# Patient Record
Sex: Male | Born: 1960 | Race: White | Hispanic: No | Marital: Married | Smoking: Never smoker
Health system: Southern US, Community
[De-identification: ages and names within clinical notes are randomized; demographics above are authoritative.]

## PROBLEM LIST (undated history)

## (undated) DIAGNOSIS — Z9889 Other specified postprocedural states: Secondary | ICD-10-CM

## (undated) DIAGNOSIS — R112 Nausea with vomiting, unspecified: Secondary | ICD-10-CM

## (undated) HISTORY — PX: ELBOW SURGERY: SHX618

## (undated) HISTORY — PX: APPENDECTOMY: SHX54

## (undated) HISTORY — PX: KNEE SURGERY: SHX244

---

## 2001-03-16 ENCOUNTER — Encounter: Payer: Self-pay | Admitting: Psychiatry

## 2001-03-16 ENCOUNTER — Ambulatory Visit (HOSPITAL_COMMUNITY): Admission: RE | Admit: 2001-03-16 | Discharge: 2001-03-16 | Payer: Self-pay | Admitting: Psychiatry

## 2010-08-14 ENCOUNTER — Ambulatory Visit (HOSPITAL_COMMUNITY)
Admission: RE | Admit: 2010-08-14 | Discharge: 2010-08-14 | Payer: Self-pay | Source: Home / Self Care | Attending: Gastroenterology | Admitting: Gastroenterology

## 2016-11-22 ENCOUNTER — Observation Stay (HOSPITAL_COMMUNITY)
Admission: EM | Admit: 2016-11-22 | Discharge: 2016-11-23 | Disposition: A | Discharge status: Home / Self Care | Payer: BLUE CROSS/BLUE SHIELD | Attending: Surgery | Admitting: Surgery

## 2016-11-22 ENCOUNTER — Emergency Department (HOSPITAL_COMMUNITY): Payer: BLUE CROSS/BLUE SHIELD

## 2016-11-22 ENCOUNTER — Encounter (HOSPITAL_COMMUNITY)
Admission: EM | Disposition: A | Discharge status: Home / Self Care | Payer: Self-pay | Source: Home / Self Care | Attending: Emergency Medicine

## 2016-11-22 ENCOUNTER — Ambulatory Visit (HOSPITAL_COMMUNITY)
Admission: EM | Admit: 2016-11-22 | Discharge: 2016-11-22 | Disposition: A | Discharge status: Home / Self Care | Payer: BLUE CROSS/BLUE SHIELD | Source: Home / Self Care

## 2016-11-22 ENCOUNTER — Encounter (HOSPITAL_COMMUNITY): Payer: Self-pay | Admitting: Emergency Medicine

## 2016-11-22 ENCOUNTER — Emergency Department (HOSPITAL_COMMUNITY): Payer: BLUE CROSS/BLUE SHIELD | Admitting: Anesthesiology

## 2016-11-22 DIAGNOSIS — K3580 Unspecified acute appendicitis: Secondary | ICD-10-CM | POA: Diagnosis present

## 2016-11-22 DIAGNOSIS — R109 Unspecified abdominal pain: Secondary | ICD-10-CM | POA: Diagnosis present

## 2016-11-22 DIAGNOSIS — K353 Acute appendicitis with localized peritonitis, without perforation or gangrene: Secondary | ICD-10-CM

## 2016-11-22 DIAGNOSIS — R1031 Right lower quadrant pain: Secondary | ICD-10-CM | POA: Diagnosis not present

## 2016-11-22 DIAGNOSIS — K429 Umbilical hernia without obstruction or gangrene: Secondary | ICD-10-CM | POA: Diagnosis not present

## 2016-11-22 HISTORY — PX: LAPAROSCOPIC APPENDECTOMY: SHX408

## 2016-11-22 HISTORY — PX: UMBILICAL HERNIA REPAIR: SHX196

## 2016-11-22 LAB — COMPREHENSIVE METABOLIC PANEL
ALT: 22 U/L (ref 17–63)
AST: 44 U/L — ABNORMAL HIGH (ref 15–41)
Albumin: 4.6 g/dL (ref 3.5–5.0)
Alkaline Phosphatase: 81 U/L (ref 38–126)
Anion gap: 7 (ref 5–15)
BUN: 15 mg/dL (ref 6–20)
CO2: 26 mmol/L (ref 22–32)
Calcium: 9.1 mg/dL (ref 8.9–10.3)
Chloride: 103 mmol/L (ref 101–111)
Creatinine, Ser: 1.07 mg/dL (ref 0.61–1.24)
GFR calc Af Amer: >60 mL/min (ref >60)
GFR calc non Af Amer: >60 mL/min (ref >60)
Glucose, Bld: 99 mg/dL (ref 65–99)
Potassium: 4.4 mmol/L (ref 3.5–5.1)
Sodium: 136 mmol/L (ref 135–145)
Total Bilirubin: 0.6 mg/dL (ref 0.3–1.2)
Total Protein: 7.3 g/dL (ref 6.5–8.1)

## 2016-11-22 LAB — CBC
HCT: 44 % (ref 39.0–52.0)
Hemoglobin: 15 g/dL (ref 13.0–17.0)
MCH: 30.3 pg (ref 26.0–34.0)
MCHC: 34.1 g/dL (ref 30.0–36.0)
MCV: 88.9 fL (ref 78.0–100.0)
Platelets: 197 10*3/uL (ref 150–400)
RBC: 4.95 MIL/uL (ref 4.22–5.81)
RDW: 12.8 % (ref 11.5–15.5)
WBC: 9.9 10*3/uL (ref 4.0–10.5)

## 2016-11-22 LAB — URINALYSIS, ROUTINE W REFLEX MICROSCOPIC
Bilirubin Urine: NEGATIVE
Glucose, UA: NEGATIVE mg/dL
Hgb urine dipstick: NEGATIVE
Ketones, ur: NEGATIVE mg/dL
Leukocytes, UA: NEGATIVE
Nitrite: NEGATIVE
Protein, ur: NEGATIVE mg/dL
Specific Gravity, Urine: 1.008 (ref 1.005–1.030)
pH: 5 (ref 5.0–8.0)

## 2016-11-22 LAB — LIPASE, BLOOD: Lipase: 37 U/L (ref 11–51)

## 2016-11-22 SURGERY — APPENDECTOMY, LAPAROSCOPIC
Anesthesia: General | Site: Abdomen

## 2016-11-22 MED ORDER — DIPHENHYDRAMINE HCL 25 MG PO CAPS: 25.0000 mg | ORAL_CAPSULE | Freq: Four times a day (QID) | ORAL | Status: DC | PRN

## 2016-11-22 MED ORDER — MIDAZOLAM HCL 2 MG/2ML IJ SOLN
INTRAMUSCULAR | Status: AC
  Filled 2016-11-22: qty 2

## 2016-11-22 MED ORDER — SODIUM CHLORIDE 0.9 % IV SOLN
INTRAVENOUS | Status: DC
  Administered 2016-11-22: 1 mL via INTRAVENOUS

## 2016-11-22 MED ORDER — ONDANSETRON HCL 4 MG/2ML IJ SOLN
4.0000 mg | Freq: Four times a day (QID) | INTRAMUSCULAR | Status: DC | PRN
  Administered 2016-11-23 (×2): 4 mg via INTRAVENOUS
  Filled 2016-11-22 (×3): qty 2

## 2016-11-22 MED ORDER — MIDAZOLAM HCL 5 MG/5ML IJ SOLN
INTRAMUSCULAR | Status: DC | PRN
  Administered 2016-11-22: 2 mg via INTRAVENOUS

## 2016-11-22 MED ORDER — MORPHINE SULFATE (PF) 2 MG/ML IV SOLN
2.0000 mg | INTRAVENOUS | Status: DC | PRN
  Administered 2016-11-23: 4 mg via INTRAVENOUS
  Filled 2016-11-22: qty 2

## 2016-11-22 MED ORDER — METRONIDAZOLE IN NACL 5-0.79 MG/ML-% IV SOLN
INTRAVENOUS | Status: DC | PRN
  Administered 2016-11-22: 500 mg via INTRAVENOUS

## 2016-11-22 MED ORDER — CLONIDINE HCL 0.1 MG PO TABS: 0.1000 mg | ORAL_TABLET | Freq: Once | ORAL | Status: DC

## 2016-11-22 MED ORDER — METRONIDAZOLE IN NACL 5-0.79 MG/ML-% IV SOLN
500.0000 mg | Freq: Three times a day (TID) | INTRAVENOUS | Status: DC
  Administered 2016-11-23: 500 mg via INTRAVENOUS
  Filled 2016-11-22 (×4): qty 100

## 2016-11-22 MED ORDER — SODIUM CHLORIDE 0.9 % IR SOLN
Status: DC | PRN
  Administered 2016-11-22: 1000 mL

## 2016-11-22 MED ORDER — IOPAMIDOL (ISOVUE-300) INJECTION 61%
INTRAVENOUS | Status: AC
  Administered 2016-11-22: 100 mL
  Filled 2016-11-22: qty 100

## 2016-11-22 MED ORDER — HYDROMORPHONE HCL 1 MG/ML IJ SOLN
0.2500 mg | INTRAMUSCULAR | Status: DC | PRN
  Administered 2016-11-22 (×2): 0.5 mg via INTRAVENOUS

## 2016-11-22 MED ORDER — OXYCODONE HCL 5 MG PO TABS: 5.0000 mg | ORAL_TABLET | ORAL | Status: DC | PRN

## 2016-11-22 MED ORDER — DEXTROSE 5 % IV SOLN
2.0000 g | Freq: Once | INTRAVENOUS | Status: AC
  Administered 2016-11-22: 2 g via INTRAVENOUS
  Filled 2016-11-22: qty 2

## 2016-11-22 MED ORDER — ONDANSETRON HCL 4 MG/2ML IJ SOLN
INTRAMUSCULAR | Status: DC | PRN
  Administered 2016-11-22: 4 mg via INTRAVENOUS

## 2016-11-22 MED ORDER — DIPHENHYDRAMINE HCL 50 MG/ML IJ SOLN: 25.0000 mg | Freq: Four times a day (QID) | INTRAMUSCULAR | Status: DC | PRN

## 2016-11-22 MED ORDER — ONDANSETRON 4 MG PO TBDP
4.0000 mg | ORAL_TABLET | Freq: Four times a day (QID) | ORAL | Status: DC | PRN
  Filled 2016-11-22: qty 1

## 2016-11-22 MED ORDER — IPRATROPIUM-ALBUTEROL 0.5-2.5 (3) MG/3ML IN SOLN: 3.0000 mL | Freq: Once | RESPIRATORY_TRACT | Status: DC

## 2016-11-22 MED ORDER — FENTANYL CITRATE (PF) 250 MCG/5ML IJ SOLN
INTRAMUSCULAR | Status: DC | PRN
  Administered 2016-11-22 (×5): 100 ug via INTRAVENOUS

## 2016-11-22 MED ORDER — ACETAMINOPHEN 650 MG RE SUPP: 650.0000 mg | Freq: Four times a day (QID) | RECTAL | Status: DC | PRN

## 2016-11-22 MED ORDER — HYDROMORPHONE HCL 1 MG/ML IJ SOLN
INTRAMUSCULAR | Status: AC
  Administered 2016-11-22: 0.5 mg via INTRAVENOUS
  Filled 2016-11-22: qty 1

## 2016-11-22 MED ORDER — SUGAMMADEX SODIUM 200 MG/2ML IV SOLN
INTRAVENOUS | Status: DC | PRN
  Administered 2016-11-22: 300 mg via INTRAVENOUS

## 2016-11-22 MED ORDER — BUPIVACAINE HCL (PF) 0.25 % IJ SOLN
INTRAMUSCULAR | Status: AC
  Filled 2016-11-22: qty 30

## 2016-11-22 MED ORDER — PROPOFOL 10 MG/ML IV BOLUS
INTRAVENOUS | Status: DC | PRN
  Administered 2016-11-22: 200 mg via INTRAVENOUS

## 2016-11-22 MED ORDER — ZOLPIDEM TARTRATE 5 MG PO TABS: 5.0000 mg | ORAL_TABLET | Freq: Every evening | ORAL | Status: DC | PRN

## 2016-11-22 MED ORDER — BUPIVACAINE HCL (PF) 0.25 % IJ SOLN
INTRAMUSCULAR | Status: DC | PRN
  Administered 2016-11-22: 7.5 mL

## 2016-11-22 MED ORDER — ROCURONIUM BROMIDE 100 MG/10ML IV SOLN
INTRAVENOUS | Status: DC | PRN
  Administered 2016-11-22: 40 mg via INTRAVENOUS

## 2016-11-22 MED ORDER — LACTATED RINGERS IV SOLN
INTRAVENOUS | Status: DC | PRN
Start: 2016-11-22 — End: 2016-11-22
  Administered 2016-11-22 (×2): via INTRAVENOUS

## 2016-11-22 MED ORDER — METRONIDAZOLE IN NACL 5-0.79 MG/ML-% IV SOLN
500.0000 mg | Freq: Once | INTRAVENOUS | Status: AC
  Administered 2016-11-22: 500 mg via INTRAVENOUS
  Filled 2016-11-22: qty 100

## 2016-11-22 MED ORDER — GLYCOPYRROLATE 0.2 MG/ML IJ SOLN
INTRAMUSCULAR | Status: DC | PRN
  Administered 2016-11-22: 0.4 mg via INTRAVENOUS

## 2016-11-22 MED ORDER — CEFTRIAXONE SODIUM 2 G IJ SOLR
2.0000 g | INTRAMUSCULAR | Status: DC
  Filled 2016-11-22: qty 2

## 2016-11-22 MED ORDER — FENTANYL CITRATE (PF) 250 MCG/5ML IJ SOLN
INTRAMUSCULAR | Status: AC
  Filled 2016-11-22: qty 5

## 2016-11-22 MED ORDER — PROMETHAZINE HCL 25 MG/ML IJ SOLN: 6.2500 mg | INTRAMUSCULAR | Status: DC | PRN

## 2016-11-22 MED ORDER — LIDOCAINE HCL (CARDIAC) 20 MG/ML IV SOLN
INTRAVENOUS | Status: DC | PRN
  Administered 2016-11-22: 100 mg via INTRATRACHEAL

## 2016-11-22 MED ORDER — ACETAMINOPHEN 325 MG PO TABS: 650.0000 mg | ORAL_TABLET | Freq: Four times a day (QID) | ORAL | Status: DC | PRN

## 2016-11-22 MED ORDER — ENOXAPARIN SODIUM 40 MG/0.4ML ~~LOC~~ SOLN
40.0000 mg | SUBCUTANEOUS | Status: DC
  Filled 2016-11-22: qty 0.4

## 2016-11-22 MED ORDER — PROPOFOL 10 MG/ML IV BOLUS
INTRAVENOUS | Status: AC
  Filled 2016-11-22: qty 20

## 2016-11-22 MED ORDER — METHYLPREDNISOLONE SODIUM SUCC 125 MG IJ SOLR: 125.0000 mg | Freq: Once | INTRAMUSCULAR | Status: DC

## 2016-11-22 MED ORDER — SUCCINYLCHOLINE CHLORIDE 20 MG/ML IJ SOLN
INTRAMUSCULAR | Status: DC | PRN
  Administered 2016-11-22: 120 mg via INTRAVENOUS

## 2016-11-22 SURGICAL SUPPLY — 48 items
APPLIER CLIP ROT 10 11.4 M/L (STAPLE)
BAG SPEC RTRVL LRG 6X4 10 (ENDOMECHANICALS) ×1
BENZOIN TINCTURE PRP APPL 2/3 (GAUZE/BANDAGES/DRESSINGS) ×2 IMPLANT
BLADE CLIPPER SURG (BLADE) IMPLANT
CANISTER SUCT 3000ML PPV (MISCELLANEOUS) ×2 IMPLANT
CHLORAPREP W/TINT 26ML (MISCELLANEOUS) ×2 IMPLANT
CLIP APPLIE ROT 10 11.4 M/L (STAPLE) IMPLANT
CLOSURE STERI-STRIP 1/4X4 (GAUZE/BANDAGES/DRESSINGS) ×2 IMPLANT
CLSR STERI-STRIP ANTIMIC 1/2X4 (GAUZE/BANDAGES/DRESSINGS) ×2 IMPLANT
COVER SURGICAL LIGHT HANDLE (MISCELLANEOUS) ×2 IMPLANT
CUTTER FLEX LINEAR 45M (STAPLE) ×2 IMPLANT
DRSG TEGADERM 2-3/8X2-3/4 SM (GAUZE/BANDAGES/DRESSINGS) ×4 IMPLANT
DRSG TEGADERM 4X4.75 (GAUZE/BANDAGES/DRESSINGS) ×2 IMPLANT
ELECT REM PT RETURN 9FT ADLT (ELECTROSURGICAL) ×2
ELECTRODE REM PT RTRN 9FT ADLT (ELECTROSURGICAL) ×1 IMPLANT
ENDOLOOP SUT PDS II  0 18 (SUTURE)
ENDOLOOP SUT PDS II 0 18 (SUTURE) IMPLANT
FILTER SMOKE EVAC LAPAROSHD (FILTER) ×2 IMPLANT
GAUZE SPONGE 2X2 8PLY STRL LF (GAUZE/BANDAGES/DRESSINGS) ×1 IMPLANT
GLOVE BIO SURGEON STRL SZ7 (GLOVE) ×2 IMPLANT
GLOVE BIOGEL M 7.0 STRL (GLOVE) ×2 IMPLANT
GLOVE BIOGEL PI IND STRL 7.5 (GLOVE) ×2 IMPLANT
GLOVE BIOGEL PI INDICATOR 7.5 (GLOVE) ×2
GLOVE ECLIPSE 7.0 STRL STRAW (GLOVE) ×2 IMPLANT
GOWN STRL REUS W/ TWL LRG LVL3 (GOWN DISPOSABLE) ×3 IMPLANT
GOWN STRL REUS W/TWL LRG LVL3 (GOWN DISPOSABLE) ×3
KIT BASIN OR (CUSTOM PROCEDURE TRAY) ×2 IMPLANT
KIT ROOM TURNOVER OR (KITS) ×2 IMPLANT
NS IRRIG 1000ML POUR BTL (IV SOLUTION) ×2 IMPLANT
PAD ARMBOARD 7.5X6 YLW CONV (MISCELLANEOUS) ×4 IMPLANT
PENCIL BUTTON HOLSTER BLD 10FT (ELECTRODE) ×2 IMPLANT
POUCH SPECIMEN RETRIEVAL 10MM (ENDOMECHANICALS) ×2 IMPLANT
RELOAD STAPLE TA45 3.5 REG BLU (ENDOMECHANICALS) ×2 IMPLANT
SET IRRIG TUBING LAPAROSCOPIC (IRRIGATION / IRRIGATOR) ×2 IMPLANT
SHEARS HARMONIC ACE PLUS 36CM (ENDOMECHANICALS) ×2 IMPLANT
SLEEVE ENDOPATH XCEL 5M (ENDOMECHANICALS) ×2 IMPLANT
SPECIMEN JAR SMALL (MISCELLANEOUS) ×2 IMPLANT
SPONGE GAUZE 2X2 STER 10/PKG (GAUZE/BANDAGES/DRESSINGS) ×1
STRIP CLOSURE SKIN 1/2X4 (GAUZE/BANDAGES/DRESSINGS) ×2 IMPLANT
SUT MNCRL AB 4-0 PS2 18 (SUTURE) ×2 IMPLANT
SUT VIC AB 3-0 SH 27 (SUTURE) ×2
SUT VIC AB 3-0 SH 27X BRD (SUTURE) ×1 IMPLANT
TOWEL OR 17X24 6PK STRL BLUE (TOWEL DISPOSABLE) ×2 IMPLANT
TOWEL OR 17X26 10 PK STRL BLUE (TOWEL DISPOSABLE) ×2 IMPLANT
TRAY LAPAROSCOPIC MC (CUSTOM PROCEDURE TRAY) ×2 IMPLANT
TROCAR XCEL BLUNT TIP 100MML (ENDOMECHANICALS) ×2 IMPLANT
TROCAR XCEL NON-BLD 5MMX100MML (ENDOMECHANICALS) ×2 IMPLANT
TUBING INSUFFLATION (TUBING) ×2 IMPLANT

## 2016-11-22 NOTE — ED Notes (Signed)
Patient transported to CT 

## 2016-11-22 NOTE — ED Provider Notes (Signed)
[facility] DEPT Provider Note   CSN: [id number] Arrival date & time: 11/22/16  1454     History   Chief Complaint Chief Complaint  Patient presents with  . Abdominal Pain    right    HPI Douglas Summers is a 56 y.o. male.  The history is provided by the patient.  Abdominal Pain   This is a new problem. The current episode started yesterday. The problem occurs constantly. The problem has been gradually worsening. The pain is associated with an unknown factor. The pain is located in the RLQ. The pain is at a severity of 5/10. The pain is moderate. Associated symptoms include anorexia and nausea. Pertinent negatives include fever, belching, diarrhea, flatus, hematochezia, melena, vomiting, constipation, dysuria, frequency, hematuria, arthralgias and myalgias. The symptoms are aggravated by palpation. Nothing relieves the symptoms. Past workup does not include surgery. His past medical history does not include PUD or Crohn's disease.    History reviewed. No pertinent past medical history.  There are no active problems to display for this patient.   Past Surgical History:  Procedure Laterality Date  . ELBOW SURGERY Left   . KNEE SURGERY Right        Home Medications    Prior to Admission medications   Not on File    Family History No family history on file.  Social History Social History  Substance Use Topics  . Smoking status: Never Smoker  . Smokeless tobacco: Never Used  . Alcohol use No     Allergies   Patient has no known allergies.   Review of Systems Review of Systems  Constitutional: Negative for chills and fever.  HENT: Negative for ear pain and sore throat.   Eyes: Negative for pain and visual disturbance.  Respiratory: Negative for cough and shortness of breath.   Cardiovascular: Negative for chest pain and palpitations.  Gastrointestinal: Positive for abdominal pain, anorexia and nausea. Negative for constipation, diarrhea, flatus,  hematochezia, melena and vomiting.  Genitourinary: Negative for dysuria, frequency and hematuria.  Musculoskeletal: Negative for arthralgias, back pain and myalgias.  Skin: Negative for color change and rash.  Neurological: Negative for seizures and syncope.  All other systems reviewed and are negative.    Physical Exam Updated Vital Signs BP 136/73   Pulse (!) 47   Temp 98.1 F (36.7 C)   Resp 16   Ht 6' (1.829 m)   Wt 100.7 kg   SpO2 99%   BMI 30.11 kg/m   Physical Exam  Constitutional: He appears well-developed and well-nourished.  HENT:  Head: Normocephalic and atraumatic.  Eyes: Conjunctivae and EOM are normal.  Neck: Neck supple.  Cardiovascular: Normal rate and regular rhythm.   No murmur heard. Pulmonary/Chest: Effort normal and breath sounds normal. No respiratory distress.  Abdominal: Soft. There is tenderness in the right lower quadrant. There is guarding.  Musculoskeletal: He exhibits no edema.  Neurological: He is alert.  Skin: Skin is warm and dry.  Psychiatric: He has a normal mood and affect.  Nursing note and vitals reviewed.    ED Treatments / Results  Labs (all labs ordered are listed, but only abnormal results are displayed) Labs Reviewed  COMPREHENSIVE METABOLIC PANEL - Abnormal; Notable for the following:       Result Value   AST 44 (*)    All other components within normal limits  URINALYSIS, ROUTINE W REFLEX MICROSCOPIC - Abnormal; Notable for the following:    Color, Urine STRAW (*)  All other components within normal limits  LIPASE, BLOOD  CBC    EKG  EKG Interpretation None       Radiology Ct Abdomen Pelvis W Contrast  Result Date: 11/22/2016 CLINICAL DATA:  Right lower quadrant abdominal pain since yesterday morning. EXAM: CT ABDOMEN AND PELVIS WITH CONTRAST TECHNIQUE: Multidetector CT imaging of the abdomen and pelvis was performed using the standard protocol following bolus administration of intravenous contrast.  CONTRAST:  173mL ISOVUE-300 IOPAMIDOL (ISOVUE-300) INJECTION 61% COMPARISON:  None. FINDINGS: Lower chest: Minimal bilateral dependent atelectasis. Borderline enlarged heart. Hepatobiliary: Small number of small liver cysts. Normal appearing gallbladder. Pancreas: Unremarkable. No pancreatic ductal dilatation or surrounding inflammatory changes. Spleen: Normal in size without focal abnormality. Adrenals/Urinary Tract: Lower pole right renal cyst. Normal appearing left kidney, ureters, urinary bladder and adrenal glands. Stomach/Bowel: Dilated appendix with diffuse wall thickening and enhancement and periappendiceal soft tissue stranding. There are also 2 small appendicoliths, measuring 6 mm and 4 mm in maximum diameter each. The maximum diameter at the appendix is 14.6 mm. No fluid collections or free peritoneal air. Normal appearing stomach, small bowel and colon. Vascular/Lymphatic: No significant vascular findings are present. No enlarged abdominal or pelvic lymph nodes. Reproductive: Moderately enlarged prostate gland containing coarse calcification. Other: Small umbilical hernia containing fat. Musculoskeletal: Lumbar and lower thoracic spine degenerative changes. IMPRESSION: 1. Acute appendicitis without abscess. 2. Small umbilical hernia containing fat. 3. Moderately enlarged prostate gland. Electronically Signed   By: Claudie Revering M.D.   On: 11/22/2016 18:00    Procedures Procedures (including critical care time)  Medications Ordered in ED Medications  iopamidol (ISOVUE-300) 61 % injection (100 mLs  Contrast Given 11/22/16 1737)     Initial Impression / Assessment and Plan / ED Course  I have reviewed the triage vital signs and the nursing notes.  Pertinent labs & imaging results that were available during my care of the patient were reviewed by me and considered in my medical decision making (see chart for details).     56 year old male with no significant past medical history presents to  the setting of right lower quadrant abdominal pain. Patient reports this started yesterday when he awoke and his continued worsens that time. Patient reports is worse with palpation and movement. Patient denies dysuria, fevers, recent sick contacts, recent travel. He does report decreased appetite since this started. Patient reports is somewhat similar to small bowel obstruction he had 20 years ago while undergoing an aggressive weightlifting regimen. Patient has had no surgery on his abdominal area. Patient seen in urgent care and sent over for likely CT scan with concern for appendicitis.  On examination patient does have tenderness to palpation right lower quadrant. Intra-abdominal laboratory analysis reveals no significant abnormalities. With concern for possible appendicitis or other intra-abdominal pathology CT scan obtained. CT scan revealed acute appendicitis without abscess. Patient did not want any pain medication and on reassessment pain not greatly changed.  Antibiotics given per protocol.  Patient will be admitted to hospital for further management as condition and discussed case with general surgery. Patient stable at time of transfer of care.  Final Clinical Impressions(s) / ED Diagnoses   Final diagnoses:  Acute appendicitis with localized peritonitis    New Prescriptions New Prescriptions   No medications on file     Esaw Grandchild, MD 11/22/16 1816    Esaw Grandchild, MD 11/22/16 1927    Elnora Morrison, MD 11/22/16 [phone]

## 2016-11-22 NOTE — Op Note (Signed)
Indications: The patient presented with a history of right-sided abdominal pain. A CT scan revealed findings consistent with acute appendicitis.  Pre-operative Diagnosis: Acute appendicitis without mention of peritonitis  Post-operative Diagnosis: Same  Procedure performed:  Laparoscopic appendectomy/ umbilical hernia repair  Surgeon: Maia Petties.   Assistants: none  Anesthesia: General endotracheal anesthesia  ASA Class: 1, E  Procedure Details  The patient was seen again in the Holding Room. The risks, benefits, complications, treatment options, and expected outcomes were discussed with the patient and/or family. The possibilities of reaction to medication, perforation of viscus, bleeding, recurrent infection, finding a normal appendix, the need for additional procedures, failure to diagnose a condition, and creating a complication requiring transfusion or operation were discussed. There was concurrence with the proposed plan and informed consent was obtained. The site of surgery was properly noted. The patient was taken to Operating Room, identified as Douglas Summers and the procedure verified as Appendectomy. A Time Out was held and the above information confirmed.  The patient was placed in the supine position and general anesthesia was induced.  The abdomen was prepped and draped in a sterile fashion. A one centimeter supraumbilical incision was made.  Dissection was carried down to the fascia bluntly.  The patient has a small umbilical hernia.  We peeled the hernia sac away from the posterior surface of the umbilicus.  We entered the peritoneal cavity bluntly.  A pursestring suture was passed around the 1 cm fascial defect with a 0 Vicryl.  The Hasson cannula was introduced into the abdomen and the tails of the suture were used to hold the Hasson in place.   The pneumoperitoneum was then established maintaining a maximum pressure of 15 mmHg.  Additional 5 mm cannulas then placed  in the left lower quadrant of the abdomen and the right upper quadrant under direct visualization. A careful evaluation of the entire abdomen was carried out. The patient was placed in Trendelenburg and left lateral decubitus position.  The scope was moved to the right upper quadrant port site. The cecum was mobilized medially.  The appendix was identified in the right pelvis.  The distal half of the appendix was quite inflamed and edematous.  It was adherent down to the pelvis.  The appendix was carefully dissected. The appendix was skeletonized with the harmonic scalpel.   The appendix was divided at its base using an endo-GIA stapler. Minimal appendiceal stump was left in place. There was no evidence of bleeding, leakage, or complication after division of the appendix. Irrigation was also performed and irrigate suctioned from the abdomen as well.  The umbilical hernia defect was closed with the purse string suture. There was no residual palpable fascial defect. 3-0 Vicryl was used to tack down the umbilical dermis to the fascia. The trocar site skin wounds were closed with 4-0 Monocryl.  Instrument, sponge, and needle counts were correct at the conclusion of the case.   Findings: The appendix was found to be inflamed. There were not signs of necrosis.  There was not perforation. There was not abscess formation.  Estimated Blood Loss:  less than 50 mL         Drains: none         Specimens: appendix         Complications:  None; patient tolerated the procedure well.         Disposition: PACU - hemodynamically stable.         Condition: stable  Douglas Summers.  Douglas Dover, MD, [facility]  General/ Trauma Surgery  11/22/2016 9:47 PM

## 2016-11-22 NOTE — Anesthesia Preprocedure Evaluation (Addendum)
Anesthesia Evaluation  Patient identified by MRN, date of birth, ID band Patient awake    Reviewed: Allergy & Precautions, NPO status , Patient's Chart, lab work & pertinent test results  Airway Mallampati: II  TM Distance: >3 FB Neck ROM: Full    Dental  (+) Dental Advisory Given   Pulmonary neg pulmonary ROS,    breath sounds clear to auscultation       Cardiovascular negative cardio ROS   Rhythm:Regular Rate:Normal     Neuro/Psych negative neurological ROS     GI/Hepatic Neg liver ROS, Acute appendicitis   Endo/Other  negative endocrine ROS  Renal/GU negative Renal ROS     Musculoskeletal   Abdominal   Peds  Hematology negative hematology ROS (+)   Anesthesia Other Findings   Reproductive/Obstetrics                            Lab Results  Component Value Date   WBC 9.9 11/22/2016   HGB 15.0 11/22/2016   HCT 44.0 11/22/2016   MCV 88.9 11/22/2016   PLT 197 11/22/2016   Lab Results  Component Value Date   CREATININE 1.07 11/22/2016   BUN 15 11/22/2016   NA 136 11/22/2016   K 4.4 11/22/2016   CL 103 11/22/2016   CO2 26 11/22/2016    Anesthesia Physical Anesthesia Plan  ASA: II and emergent  Anesthesia Plan: General   Post-op Pain Management:    Induction: Intravenous  Airway Management Planned: Oral ETT  Additional Equipment:   Intra-op Plan:   Post-operative Plan: Extubation in OR  Informed Consent: I have reviewed the patients History and Physical, chart, labs and discussed the procedure including the risks, benefits and alternatives for the proposed anesthesia with the patient or authorized representative who has indicated his/her understanding and acceptance.   Dental advisory given  Plan Discussed with: CRNA, Anesthesiologist and Surgeon  Anesthesia Plan Comments:       Anesthesia Quick Evaluation

## 2016-11-22 NOTE — ED Notes (Signed)
Bedside report given to [place] in [place]

## 2016-11-22 NOTE — ED Provider Notes (Signed)
CSN: [id number]     Arrival date & time 11/22/16  1304 History   First MD Initiated Contact with Patient 11/22/16 1431     Chief Complaint  Patient presents with  . Abdominal Pain   (Consider location/radiation/quality/duration/timing/severity/associated sxs/prior Treatment) 56 year old male presents to clinic for evaluation of right lower abdominal pain. Denies any fever, chills, however does have a loss of appetite, has no vomiting and reports no diarrhea. Bowel movements have been described as regular, last normal bowel movement was today. No suspicious food intake. He still has both gallbladder, and appendix.   The history is provided by the patient.  Abdominal Pain  Pain location:  RLQ Pain quality: aching, cramping, dull and sharp   Pain radiates to:  Does not radiate Pain severity:  Moderate Onset quality:  Gradual Duration:  2 days Timing:  Constant Progression:  Worsening Chronicity:  New Context: not alcohol use, not diet changes, not previous surgeries and not suspicious food intake   Relieved by:  Lying down Worsened by:  Movement, palpation and position changes Ineffective treatments:  None tried Associated symptoms: fatigue and nausea   Associated symptoms: no anorexia, no chest pain, no chills, no constipation, no diarrhea, no fever and no vomiting     History reviewed. No pertinent past medical history. Past Surgical History:  Procedure Laterality Date  . ELBOW SURGERY Left   . KNEE SURGERY Right    History reviewed. No pertinent family history. Social History  Substance Use Topics  . Smoking status: Never Smoker  . Smokeless tobacco: Never Used  . Alcohol use No    Review of Systems  Constitutional: Positive for fatigue. Negative for chills and fever.  HENT: Negative.   Respiratory: Negative.   Cardiovascular: Negative for chest pain and palpitations.  Gastrointestinal: Positive for abdominal pain and nausea. Negative for anorexia, constipation,  diarrhea and vomiting.  Musculoskeletal: Negative.   Skin: Negative.   Neurological: Negative.     Allergies  Patient has no known allergies.  Home Medications   Prior to Admission medications   Not on File   Meds Ordered and Administered this Visit  Medications - No data to display  BP (!) 153/78 (BP Location: Left Arm)   Pulse (!) 51   Temp 98.7 F (37.1 C) (Oral)   Resp 16   SpO2 99%  No data found.   Physical Exam  Constitutional: He is oriented to person, place, and time. He appears well-developed and well-nourished. No distress.  HENT:  Head: Normocephalic and atraumatic.  Right Ear: External ear normal.  Left Ear: External ear normal.  Eyes: Conjunctivae are normal. Right eye exhibits no discharge. Left eye exhibits no discharge.  Cardiovascular: Normal rate and regular rhythm.   Pulmonary/Chest: Effort normal and breath sounds normal.  Abdominal: Soft. Normal appearance and bowel sounds are normal. There is no hepatosplenomegaly. There is tenderness in the right lower quadrant. There is rebound and tenderness at McBurney's point.  Neurological: He is alert and oriented to person, place, and time.  Skin: Skin is warm and dry. Capillary refill takes less than 2 seconds. He is not diaphoretic.  Psychiatric: He has a normal mood and affect. His behavior is normal.  Nursing note and vitals reviewed.   Urgent Care Course     Procedures (including critical care time)  Labs Review Labs Reviewed - No data to display  Imaging Review No results found.     MDM   1. Right lower quadrant abdominal pain  Patient discharged to the emergency room for further evaluation and rule out of appendicitis due to right lower quadrant rebound tenderness.     Barnet Glasgow, NP 11/22/16 [phone]

## 2016-11-22 NOTE — H&P (Signed)
Douglas Summers is an 56 y.o. male.   Chief Complaint:  Acute appendicitis HPI: 84 -year-old male in good health presents with a 36 hour history of lower abdominal pain that has now localized to his right lower quadrant. This has increased in severity over the last half a day. He went to urgent care who was concerned for appendicitis. He was then referred to the emergency department for evaluation. CT scan showed appendicitis with no sign of perforation. The patient has not had any pain medication. Last meal was 8 AM. He did have some water around noon.  History reviewed. No pertinent past medical history.  Past Surgical History:  Procedure Laterality Date  . ELBOW SURGERY Left   . KNEE SURGERY Right     No family history on file. Social History:  reports that he has never smoked. He has never used smokeless tobacco. He reports that he does not drink alcohol or use drugs.  Allergies: No Known Allergies  Prior to Admission medications   Not on File      Results for orders placed or performed during the hospital encounter of 11/22/16 (from the past 48 hour(s))  Lipase, blood     Status: None   Collection Time: 11/22/16  3:22 PM  Result Value Ref Range   Lipase 37 11 - 51 U/L  Comprehensive metabolic panel     Status: Abnormal   Collection Time: 11/22/16  3:22 PM  Result Value Ref Range   Sodium 136 135 - 145 mmol/L   Potassium 4.4 3.5 - 5.1 mmol/L   Chloride 103 101 - 111 mmol/L   CO2 26 22 - 32 mmol/L   Glucose, Bld 99 65 - 99 mg/dL   BUN 15 6 - 20 mg/dL   Creatinine, Ser 1.07 0.61 - 1.24 mg/dL   Calcium 9.1 8.9 - 10.3 mg/dL   Total Protein 7.3 6.5 - 8.1 g/dL   Albumin 4.6 3.5 - 5.0 g/dL   AST 44 (H) 15 - 41 U/L   ALT 22 17 - 63 U/L   Alkaline Phosphatase 81 38 - 126 U/L   Total Bilirubin 0.6 0.3 - 1.2 mg/dL   GFR calc non Af Amer >60 >60 mL/min   GFR calc Af Amer >60 >60 mL/min    Comment: (NOTE) The eGFR has been calculated using the CKD EPI equation. This  calculation has not been validated in all clinical situations. eGFR's persistently <60 mL/min signify possible Chronic Kidney Disease.    Anion gap 7 5 - 15  CBC     Status: None   Collection Time: 11/22/16  3:22 PM  Result Value Ref Range   WBC 9.9 4.0 - 10.5 K/uL   RBC 4.95 4.22 - 5.81 MIL/uL   Hemoglobin 15.0 13.0 - 17.0 g/dL   HCT 44.0 39.0 - 52.0 %   MCV 88.9 78.0 - 100.0 fL   MCH 30.3 26.0 - 34.0 pg   MCHC 34.1 30.0 - 36.0 g/dL   RDW 12.8 11.5 - 15.5 %   Platelets 197 150 - 400 K/uL  Urinalysis, Routine w reflex microscopic     Status: Abnormal   Collection Time: 11/22/16  3:40 PM  Result Value Ref Range   Color, Urine STRAW (A) YELLOW   APPearance CLEAR CLEAR   Specific Gravity, Urine 1.008 1.005 - 1.030   pH 5.0 5.0 - 8.0   Glucose, UA NEGATIVE NEGATIVE mg/dL   Hgb urine dipstick NEGATIVE NEGATIVE   Bilirubin Urine NEGATIVE NEGATIVE  Ketones, ur NEGATIVE NEGATIVE mg/dL   Protein, ur NEGATIVE NEGATIVE mg/dL   Nitrite NEGATIVE NEGATIVE   Leukocytes, UA NEGATIVE NEGATIVE   Ct Abdomen Pelvis W Contrast  Result Date: 11/22/2016 CLINICAL DATA:  Right lower quadrant abdominal pain since yesterday morning. EXAM: CT ABDOMEN AND PELVIS WITH CONTRAST TECHNIQUE: Multidetector CT imaging of the abdomen and pelvis was performed using the standard protocol following bolus administration of intravenous contrast. CONTRAST:  149m ISOVUE-300 IOPAMIDOL (ISOVUE-300) INJECTION 61% COMPARISON:  None. FINDINGS: Lower chest: Minimal bilateral dependent atelectasis. Borderline enlarged heart. Hepatobiliary: Small number of small liver cysts. Normal appearing gallbladder. Pancreas: Unremarkable. No pancreatic ductal dilatation or surrounding inflammatory changes. Spleen: Normal in size without focal abnormality. Adrenals/Urinary Tract: Lower pole right renal cyst. Normal appearing left kidney, ureters, urinary bladder and adrenal glands. Stomach/Bowel: Dilated appendix with diffuse wall thickening  and enhancement and periappendiceal soft tissue stranding. There are also 2 small appendicoliths, measuring 6 mm and 4 mm in maximum diameter each. The maximum diameter at the appendix is 14.6 mm. No fluid collections or free peritoneal air. Normal appearing stomach, small bowel and colon. Vascular/Lymphatic: No significant vascular findings are present. No enlarged abdominal or pelvic lymph nodes. Reproductive: Moderately enlarged prostate gland containing coarse calcification. Other: Small umbilical hernia containing fat. Musculoskeletal: Lumbar and lower thoracic spine degenerative changes. IMPRESSION: 1. Acute appendicitis without abscess. 2. Small umbilical hernia containing fat. 3. Moderately enlarged prostate gland. Electronically Signed   By: SClaudie ReveringM.D.   On: 11/22/2016 18:00    Review of Systems  Constitutional: Negative for weight loss.  HENT: Negative for ear discharge, ear pain, hearing loss and tinnitus.   Eyes: Negative for blurred vision, double vision, photophobia and pain.  Respiratory: Negative for cough, sputum production and shortness of breath.   Cardiovascular: Negative for chest pain.  Gastrointestinal: Positive for abdominal pain. Negative for nausea and vomiting.  Genitourinary: Negative for dysuria, flank pain, frequency and urgency.  Musculoskeletal: Negative for back pain, falls, joint pain, myalgias and neck pain.  Neurological: Negative for dizziness, tingling, sensory change, focal weakness, loss of consciousness and headaches.  Endo/Heme/Allergies: Does not bruise/bleed easily.  Psychiatric/Behavioral: Negative for depression, memory loss and substance abuse. The patient is not nervous/anxious.     Blood pressure (!) 148/79, pulse (!) 46, temperature 98.1 F (36.7 C), resp. rate 16, height 6' (1.829 m), weight 100.7 kg (222 lb), SpO2 96 %. Physical Exam  WDWN in NAD Eyes:  Pupils equal, round; sclera anicteric HENT:  Oral mucosa moist; good dentition   Neck:  No masses palpated, no thyromegaly Lungs:  CTA bilaterally; normal respiratory effort CV:  Regular rate and rhythm; no murmurs; extremities well-perfused with no edema Abd:  +bowel sounds, soft, tender in RLQ, no palpable organomegaly; small umbilical hernia - reducible Skin:  Warm, dry; no sign of jaundice Psychiatric - alert and oriented x 4; calm mood and affect  Assessment/Plan Acute appendicitis - no sign of perforation  Laparoscopic appendectomy.  The surgical procedure has been discussed with the patient.  Potential risks, benefits, alternative treatments, and expected outcomes have been explained.  All of the patient's questions at this time have been answered.  The likelihood of reaching the patient's treatment goal is good.  The patient understand the proposed surgical procedure and wishes to proceed.   TMaia Petties, MD 11/22/2016, 7:19 PM

## 2016-11-22 NOTE — ED Notes (Signed)
Returned from ct scan 

## 2016-11-22 NOTE — Anesthesia Procedure Notes (Signed)
Procedure Name: Intubation Date/Time: 11/22/2016 8:54 PM Performed by: Maude Leriche D Pre-anesthesia Checklist: Patient identified, Emergency Drugs available, Suction available, Patient being monitored and Timeout performed Patient Re-evaluated:Patient Re-evaluated prior to inductionOxygen Delivery Method: Circle system utilized Preoxygenation: Pre-oxygenation with 100% oxygen Intubation Type: IV induction and Cricoid Pressure applied Ventilation: Mask ventilation without difficulty Laryngoscope Size: Miller and 2 Grade View: Grade I Tube type: Oral Tube size: 7.5 mm Number of attempts: 1 Airway Equipment and Method: Stylet Placement Confirmation: ETT inserted through vocal cords under direct vision,  positive ETCO2 and breath sounds checked- equal and bilateral Secured at: 24 cm Tube secured with: Tape Dental Injury: Teeth and Oropharynx as per pre-operative assessment

## 2016-11-22 NOTE — Transfer of Care (Signed)
Immediate Anesthesia Transfer of Care Note  Patient: AIKAM VINJE  Procedure(s) Performed: Procedure(s): APPENDECTOMY LAPAROSCOPIC (N/A) HERNIA REPAIR UMBILICAL ADULT (N/A)  Patient Location: PACU  Anesthesia Type:General  Level of Consciousness: awake  Airway & Oxygen Therapy: Patient Spontanous Breathing  Post-op Assessment: Report given to RN and Post -op Vital signs reviewed and stable  Post vital signs: Reviewed and stable  Last Vitals:  Vitals:   11/22/16 1927 11/22/16 2147  BP: (!) 158/87 (!) (P) 148/73  Pulse: (!) 50 (P) 83  Resp: 18 (P) 16  Temp:  (P) 36.3 C    Last Pain:  Vitals:   11/22/16 1930  PainSc: 4          Complications: No apparent anesthesia complications

## 2016-11-22 NOTE — ED Triage Notes (Signed)
The patient presented to the [facility] with a complaint of lower right abdominal pain x 1 day. The patient denied any N/V/D. The patient reported "somewhat" normal bowel movements.

## 2016-11-22 NOTE — ED Triage Notes (Signed)
Pt. Stated, I've had right side pain for 2 days with some loss of appetite and some gas.

## 2016-11-22 NOTE — Discharge Instructions (Signed)
Because you have right sided lower quadrant rebound tenderness, I recommend you go to the emergency room as soon as possible for further evaluation. We do not have a CT scanner here nor do we have ultrasound here so I'm unable to rule out appendicitis. Please do not eat anything or drink anything until you're cleared to do so by an ER physician.

## 2016-11-22 NOTE — Anesthesia Postprocedure Evaluation (Signed)
Anesthesia Post Note  Patient: Douglas Summers  Procedure(s) Performed: Procedure(s) (LRB): APPENDECTOMY LAPAROSCOPIC (N/A) HERNIA REPAIR UMBILICAL ADULT (N/A)  Patient location during evaluation: PACU Anesthesia Type: General Level of consciousness: awake and alert Pain management: pain level controlled Vital Signs Assessment: post-procedure vital signs reviewed and stable Respiratory status: spontaneous breathing, nonlabored ventilation, respiratory function stable and patient connected to nasal cannula oxygen Cardiovascular status: blood pressure returned to baseline and stable Postop Assessment: no signs of nausea or vomiting Anesthetic complications: no       Last Vitals:  Vitals:   11/22/16 2200 11/22/16 2215  BP: 140/69 140/72  Pulse:    Resp:    Temp:  36.7 C    Last Pain:  Vitals:   11/22/16 2147  PainSc: 5                  Tiajuana Amass

## 2016-11-23 ENCOUNTER — Encounter (HOSPITAL_COMMUNITY): Payer: Self-pay | Admitting: Surgery

## 2016-11-23 MED ORDER — MORPHINE SULFATE (PF) 2 MG/ML IV SOLN: 2.0000 mg | INTRAVENOUS | Status: DC | PRN

## 2016-11-23 MED ORDER — ACETAMINOPHEN 325 MG PO TABS
650.0000 mg | ORAL_TABLET | Freq: Four times a day (QID) | ORAL | Status: DC
  Administered 2016-11-23: 650 mg via ORAL
  Filled 2016-11-23: qty 2

## 2016-11-23 MED ORDER — OXYCODONE HCL 5 MG PO TABS: 5.0000 mg | ORAL_TABLET | ORAL | Status: DC | PRN

## 2016-11-23 MED ORDER — SIMETHICONE 80 MG PO CHEW: 80.0000 mg | CHEWABLE_TABLET | Freq: Four times a day (QID) | ORAL | Status: DC | PRN

## 2016-11-23 MED ORDER — ONDANSETRON 4 MG PO TBDP: 4.0000 mg | ORAL_TABLET | Freq: Four times a day (QID) | ORAL | 0 refills | Status: DC | PRN

## 2016-11-23 MED ORDER — ACETAMINOPHEN 650 MG RE SUPP: 650.0000 mg | Freq: Four times a day (QID) | RECTAL | Status: DC

## 2016-11-23 MED ORDER — OXYCODONE HCL 5 MG PO TABS: 5.0000 mg | ORAL_TABLET | ORAL | 0 refills | Status: DC | PRN

## 2016-11-23 MED ORDER — IBUPROFEN 600 MG PO TABS
600.0000 mg | ORAL_TABLET | Freq: Four times a day (QID) | ORAL | Status: DC
  Administered 2016-11-23: 600 mg via ORAL
  Filled 2016-11-23: qty 1

## 2016-11-23 NOTE — Progress Notes (Signed)
Patient ID: Douglas Summers, male   DOB: 06-27-1961, 56 y.o.   MRN: [id number]  Douglas Summers Surgery Progress Note  1 Day Post-Op  Subjective: CC- acute appendicitis Overall doing well this morning. Pain well controlled. States that he does feel bloated and a little nauseated. No flatus or BM. He has had fluids but no solid food since surgery.   Objective: Vital signs in last 24 hours: Temp:  [97.3 F (36.3 C)-98.7 F (37.1 C)] 97.8 F (36.6 C) (04/23 0500) Pulse Rate:  [43-83] 52 (04/23 0500) Resp:  [15-20] 20 (04/23 0500) BP: (115-158)/(57-96) 115/67 (04/23 0500) SpO2:  [94 %-100 %] 95 % (04/23 0500) Weight:  [222 lb (100.7 kg)-233 lb 11 oz (106 kg)] 233 lb 11 oz (106 kg) (04/22 2254) Last BM Date: 11/21/16  Intake/Output from previous day: 04/22 0701 - 04/23 0700 In: 1840.8 [P.O.:120; I.V.:1620.8; IV Piggyback:100] Out: 25 [Blood:25] Intake/Output this shift: No intake/output data recorded.  PE: Gen:  Alert, NAD, pleasant Card:  RRR, no M/G/R heard Pulm:  CTAB, no W/R/R, effort normal Abd: Soft, mild distension, +BS, multiple lap incisions with clean/dry dressings  Lab Results:   Recent Labs  11/22/16 1522  WBC 9.9  HGB 15.0  HCT 44.0  PLT 197   BMET  Recent Labs  11/22/16 1522  NA 136  K 4.4  CL 103  CO2 26  GLUCOSE 99  BUN 15  CREATININE 1.07  CALCIUM 9.1   PT/INR No results for input(s): LABPROT, INR in the last 72 hours. CMP     Component Value Date/Time   NA 136 11/22/2016 1522   K 4.4 11/22/2016 1522   CL 103 11/22/2016 1522   CO2 26 11/22/2016 1522   GLUCOSE 99 11/22/2016 1522   BUN 15 11/22/2016 1522   CREATININE 1.07 11/22/2016 1522   CALCIUM 9.1 11/22/2016 1522   PROT 7.3 11/22/2016 1522   ALBUMIN 4.6 11/22/2016 1522   AST 44 (H) 11/22/2016 1522   ALT 22 11/22/2016 1522   ALKPHOS 81 11/22/2016 1522   BILITOT 0.6 11/22/2016 1522   GFRNONAA >60 11/22/2016 1522   GFRAA >60 11/22/2016 1522   Lipase     Component Value  Date/Time   LIPASE 37 11/22/2016 1522       Studies/Results: Ct Abdomen Pelvis W Contrast  Result Date: 11/22/2016 CLINICAL DATA:  Right lower quadrant abdominal pain since yesterday morning. EXAM: CT ABDOMEN AND PELVIS WITH CONTRAST TECHNIQUE: Multidetector CT imaging of the abdomen and pelvis was performed using the standard protocol following bolus administration of intravenous contrast. CONTRAST:  195mL ISOVUE-300 IOPAMIDOL (ISOVUE-300) INJECTION 61% COMPARISON:  None. FINDINGS: Lower chest: Minimal bilateral dependent atelectasis. Borderline enlarged heart. Hepatobiliary: Small number of small liver cysts. Normal appearing gallbladder. Pancreas: Unremarkable. No pancreatic ductal dilatation or surrounding inflammatory changes. Spleen: Normal in size without focal abnormality. Adrenals/Urinary Tract: Lower pole right renal cyst. Normal appearing left kidney, ureters, urinary bladder and adrenal glands. Stomach/Bowel: Dilated appendix with diffuse wall thickening and enhancement and periappendiceal soft tissue stranding. There are also 2 small appendicoliths, measuring 6 mm and 4 mm in maximum diameter each. The maximum diameter at the appendix is 14.6 mm. No fluid collections or free peritoneal air. Normal appearing stomach, small bowel and colon. Vascular/Lymphatic: No significant vascular findings are present. No enlarged abdominal or pelvic lymph nodes. Reproductive: Moderately enlarged prostate gland containing coarse calcification. Other: Small umbilical hernia containing fat. Musculoskeletal: Lumbar and lower thoracic spine degenerative changes. IMPRESSION: 1. Acute appendicitis without abscess.  2. Small umbilical hernia containing fat. 3. Moderately enlarged prostate gland. Electronically Signed   By: Claudie Revering M.D.   On: 11/22/2016 18:00    Anti-infectives: Anti-infectives    Start     Dose/Rate Route Frequency Ordered Stop   11/23/16 2000  cefTRIAXone (ROCEPHIN) 2 g in dextrose 5 % 50  mL IVPB     2 g 100 mL/hr over 30 Minutes Intravenous Every 24 hours 11/22/16 2153     11/23/16 0600  metroNIDAZOLE (FLAGYL) IVPB 500 mg     500 mg 100 mL/hr over 60 Minutes Intravenous Every 8 hours 11/22/16 2153     11/22/16 1930  cefTRIAXone (ROCEPHIN) 2 g in dextrose 5 % 50 mL IVPB     2 g 100 mL/hr over 30 Minutes Intravenous  Once 11/22/16 1926 11/22/16 2008   11/22/16 1930  metroNIDAZOLE (FLAGYL) IVPB 500 mg     500 mg 100 mL/hr over 60 Minutes Intravenous  Once 11/22/16 1926 11/22/16 2044       Assessment/Plan Acute appendicitis S/p Laparoscopic appendectomy/ umbilical hernia repair - POD 1  ID - rocephin/flagyl 4/22>> FEN - regular diet VTE - lovenox  Plan - regular diet this morning. Encourage ambulation. Home later today if he tolerates regular food. Follow-up in [place] [facility] in 2-3 weeks. Pain med rx on chart.   LOS: 0 days    Jerrye Beavers , [facility] 11/23/2016, 8:45 AM Pager: [phone] Consults: [phone] Mon-Fri 7:00 am-4:30 pm Sat-Sun 7:00 am-11:30 am

## 2016-11-23 NOTE — Care Management Note (Signed)
Case Management Note  Patient Details  Name: Douglas Summers MRN: [id number] Date of Birth: 1960-08-20  Subjective/Objective:                    Action/Plan:  No needs identified  Expected Discharge Date:                  Expected Discharge Plan:  Home/Self Care  In-House Referral:     Discharge planning Services     Post Acute Care Choice:    Choice offered to:     DME Arranged:    DME Agency:     HH Arranged:    [place] Agency:     Status of Service:  Completed, signed off  If discussed at [employer] of Stay Meetings, dates discussed:    Additional Comments:  Marilu Favre, RN 11/23/2016, 11:34 AM

## 2016-11-23 NOTE — Discharge Instructions (Addendum)
Appendicitis The appendix is a finger-shaped tube that is attached to the large intestine. Appendicitis is inflammation of the appendix. Without treatment, appendicitis can cause the appendix to tear (rupture). A ruptured appendix can lead to a life-threatening infection. It can also lead to the formation of a painful collection of pus (abscess) in the appendix. What are the causes? This condition may be caused by a blockage in the appendix that leads to infection. The blockage can be due to:  A ball of stool.  Enlarged lymph glands. In some cases, the cause may not be known. What increases the risk? This condition is more likely to develop in people who are 66-49 years of age. What are the signs or symptoms? Symptoms of this condition include:  Pain around the belly button that moves toward the lower right abdomen. The pain can become more severe as time passes. It gets worse with coughing or sudden movements.  Tenderness in the lower right abdomen.  Nausea.  Vomiting.  Loss of appetite.  Fever.  Constipation.  Diarrhea.  Generally not feeling well. How is this diagnosed? This condition may be diagnosed with:  A physical exam.  Blood tests.  Urine test. To confirm the diagnosis, an ultrasound, MRI, or CT scan may be done. How is this treated? This condition is usually treated by taking out the appendix (appendectomy). There are two methods for doing an appendectomy:  Open appendectomy. In this surgery, the appendix is removed through a large cut (incision) that is made in the lower right abdomen. This procedure may be recommended if:  You have major scarring from a previous surgery.  You have a bleeding disorder.  You are pregnant and are near term.  You have a condition that makes the laparoscopic procedure impossible, such as an advanced infection or a ruptured appendix.  Laparoscopic appendectomy. In this surgery, the appendix is removed through small  incisions. This procedure usually causes less pain and fewer problems than an open appendectomy. It also has a shorter recovery time. If the appendix has ruptured and an abscess has formed, a drain may be placed into the abscess to remove fluid and antibiotic medicines may be given through an IV tube. The appendix may or may not need to be removed. This information is not intended to replace advice given to you by your health care provider. Make sure you discuss any questions you have with your health care provider. Document Released: 07/20/2005 Document Revised: 11/27/2015 Document Reviewed: 12/05/2014 Elsevier Interactive Patient Education  2017 [place].  Exploratory Laparotomy, Adult Exploratory laparotomy is a surgical procedure to examine the organs inside your belly (abdomen). Another name for this is abdominal exploration. You may have this procedure if you have abdominal pain, trauma, bleeding, infection, or obstruction. The procedure may be done if your health care provider cannot make a diagnosis from only an exam and testing. Exploratory laparotomy may be a planned procedure or an emergency procedure. You may have surgical treatment as part of the laparotomy, or you may have additional treatment after your laparotomy. This will depend on what your surgeon finds during the procedure. Tell a health care provider about:  Any allergies you have.  All medicines you are taking, including vitamins, herbs, eye drops, creams, and over-the-counter medicines.  Any problems you or family members have had with anesthetic medicines.  Any blood disorders you have.  Any surgeries you have had.  Any medical conditions you have. What are the risks? Generally, this is a  safe procedure. However, problems can occur and include:  Bleeding.  Infection.  A blood clot that forms in your leg and travels to your lungs.  Damage to organs inside your abdomen.  Scar tissue that blocks your  digestive tract. What happens before the procedure?  Ask your health care provider about:  Changing or stopping your regular medicines. This is especially important if you are taking diabetes medicines or blood thinners.  Taking medicines such as aspirin and ibuprofen. These medicines can thin your blood. Do not take these medicines before your procedure if your health care provider instructs you not to.  Do not eat or drink anything after midnight on the night before the procedure or as directed by your health care provider.  You may be given instructions for clearing out your bowel before surgery (bowel prep). If you are already in the hospital, the bowel prep may be done there. What happens during the procedure?  An IV tube may be inserted into a vein. You may receive fluids and medicine through the IV tube. This may include antibiotic medicine to treat or prevent infection.  You will be given a medicine that makes you go to sleep (general anesthetic).  You may have a tube placed through your nose and into your stomach (nasogastric tube) to drain your stomach fluids.  You may have a tube placed into your bladder (urinary catheter) to drain urine.  Your abdomen will be cleaned with a germ-killing solution (antiseptic).  The surgeon will make a surgical cut (incision) in your abdomen. This is usually an up-and-down incision in the midsection of your abdomen. The incision will go through the inside lining of your abdomen (peritoneum).  Your surgeon will spread the incision wide enough to examine the inside of your abdomen.  The rest of the procedure will depend on what the surgeon finds:  The surgeon will check all organs in your abdomen for damage or obstruction. Repairs will be made when possible.  If there is blood in the abdomen, the surgeon will look for the source of the bleeding in order to stop it.  If there is yellowish-white fluid (pus) or gastric fluids in your abdomen,  the surgeon will check for an infection or a hole (perforation) in your digestive tract.  If the surgeon finds infection, a drain may be placed to empty fluid that can build up in your abdomen after surgery.  If there is a growth (tumor) inside your abdomen, the surgeon may remove a piece of the growth (biopsy) to examine it under a microscope.  When all procedures are complete, the surgeon will close your abdomen with layers of stitches (sutures).  The incision through the skin of your abdomen will be closed with sutures or staples. What happens after the procedure?  Your blood pressure, heart rate, breathing rate, and blood oxygen level will be monitored often until the medicines you were given have worn off.  You will continue to receive fluids and nutrition through your IV tube. This will stop when you can eat and drink on your own.  You may also get antibiotic medicine and pain medicine through your IV tube.  Your nasogastric tube may be removed when you start to pass gas.  Your urinary catheter may be removed when the anesthetic wears off. This information is not intended to replace advice given to you by your health care provider. Make sure you discuss any questions you have with your health care provider. Document Released: 04/14/2001 Document  Revised: 12/26/2015 Document Reviewed: 03/07/2014 Elsevier Interactive Patient Education  2017 [place]: POST OP INSTRUCTIONS  1. DIET: Follow a light bland diet the first 24 hours after arrival home, such as soup, liquids, crackers, etc. Be sure to include lots of fluids daily. Avoid fast food or heavy meals as your are more likely to get nauseated. Eat a low fat the next few days after surgery.  2. Take your usually prescribed home medications unless otherwise directed. 3. PAIN CONTROL:  1. Pain is best controlled by a usual combination of three different methods TOGETHER:  1. Ice/Heat 2. Over the counter pain  medication 3. Prescription pain medication 2. Most patients will experience some swelling and bruising around the incisions. Ice packs or heating pads (30-60 minutes up to 6 times a day) will help. Use ice for the first few days to help decrease swelling and bruising, then switch to heat to help relax tight/sore spots and speed recovery. Some people prefer to use ice alone, heat alone, alternating between ice & heat. Experiment to what works for you. Swelling and bruising can take several weeks to resolve.  3. It is helpful to take an over-the-counter pain medication regularly for the first few weeks. Choose one of the following that works best for you:  1. Naproxen (Aleve, etc) Two 220mg  tabs twice a day 2. Ibuprofen (Advil, etc) Three 200mg  tabs four times a day (every meal & bedtime) 3. Acetaminophen (Tylenol, etc) 500-650mg  four times a day (every meal & bedtime) 4. A prescription for pain medication (such as oxycodone, hydrocodone, etc) should be given to you upon discharge. Take your pain medication as prescribed.  1. If you are having problems/concerns with the prescription medicine (does not control pain, nausea, vomiting, rash, itching, etc), please call us [phone] to see if we need to switch you to a different pain medicine that will work better for you and/or control your side effect better. 2. If you need a refill on your pain medication, please contact your pharmacy. They will contact our office to request authorization. Prescriptions will not be filled after 5 pm or on week-ends. 4. Avoid getting constipated. Between the surgery and the pain medications, it is common to experience some constipation. Increasing fluid intake and taking a fiber supplement (such as Metamucil, Citrucel, FiberCon, MiraLax, etc) 1-2 times a day regularly will usually help prevent this problem from occurring. A mild laxative (prune juice, Milk of Magnesia, MiraLax, etc) should be taken according to package  directions if there are no bowel movements after 48 hours.  5. Watch out for diarrhea. If you have many loose bowel movements, simplify your diet to bland foods & liquids for a few days. Stop any stool softeners and decrease your fiber supplement. Switching to mild anti-diarrheal medications (Kayopectate, Pepto Bismol) can help. If this worsens or does not improve, please call us. 6. Wash / shower every day. You may shower over the dressings as they are waterproof. Continue to shower over incision(s) after the dressing is off. If there is glue over the incisions try not to pick it off, let it fall off naturally. 7. Remove your waterproof bandages 2 days after surgery. You may leave the incision open to air. You may replace a dressing/Band-Aid to cover the incision for comfort if you wish.  8. ACTIVITIES as tolerated:  1. You may resume regular (light) daily activities beginning the next day--such as daily self-care, walking, climbing stairs--gradually increasing activities as tolerated. If you  can walk 30 minutes without difficulty, it is safe to try more intense activity such as jogging, treadmill, bicycling, low-impact aerobics, swimming, etc. 2. Save the most intensive and strenuous activity for last such as sit-ups, heavy lifting, contact sports, etc Refrain from any heavy lifting or straining until you are off narcotics for pain control. For the first 2-3 weeks do not lift over 10-15lb.  3. DO NOT PUSH THROUGH PAIN. Let pain be your guide: If it hurts to do something, don't do it. Pain is your body warning you to avoid that activity for another week until the pain goes down. 4. You may drive when you are no longer taking prescription pain medication, you can comfortably wear a seatbelt, and you can safely maneuver your car and apply brakes. 5. You may have sexual intercourse when it is comfortable.  9. FOLLOW UP in our office  1. Please call [facility] at [phone] to set up an appointment to see your  surgeon in the office for a follow-up appointment approximately 2-3 weeks after your surgery. 2. Make sure that you call for this appointment the day you arrive home to insure a convenient appointment time.      10. IF YOU HAVE DISABILITY OR FAMILY LEAVE FORMS, BRING THEM TO THE               OFFICE FOR PROCESSING.   WHEN TO CALL us [phone]:  1. Poor pain control 2. Reactions / problems with new medications (rash/itching, nausea, etc)  3. Fever over 101.5 F (38.5 C) 4. Inability to urinate 5. Nausea and/or vomiting 6. Worsening swelling or bruising 7. Continued bleeding from incision. 8. Increased pain, redness, or drainage from the incision  The clinic staff is available to answer your questions during regular business hours (8:30am-5pm). Please dont hesitate to call and ask to speak to one of our nurses for clinical concerns.  If you have a medical emergency, go to the nearest emergency room or call 911.  A surgeon from [facility] is always on call at the [facility], [place], [place], [place], [place] [address] ?  MAIN: [phone] ? TOLL FREE: [phone] ?  FAX [phone]  [email or web address]

## 2016-11-23 NOTE — Progress Notes (Signed)
All discharge instructions reviewed in detail with pt & wife with understanding verbalized by both.  No questions or concerns prior to discharge.  Pt discharged to home with wife in stable condition at approx 1600.

## 2016-11-24 NOTE — Discharge Summary (Signed)
[facility] Discharge Summary   Patient ID: Douglas Summers MRN: [id number] DOB/AGE: Mar 09, 1961 56 y.o.  Admit date: 11/22/2016 Discharge date: 11/24/2016   Discharge Diagnosis Patient Active Problem List   Diagnosis Date Noted  . Acute appendicitis 11/22/2016   Imaging: Ct Abdomen Pelvis W Contrast  Result Date: 11/22/2016 CLINICAL DATA:  Right lower quadrant abdominal pain since yesterday morning. EXAM: CT ABDOMEN AND PELVIS WITH CONTRAST TECHNIQUE: Multidetector CT imaging of the abdomen and pelvis was performed using the standard protocol following bolus administration of intravenous contrast. CONTRAST:  143mL ISOVUE-300 IOPAMIDOL (ISOVUE-300) INJECTION 61% COMPARISON:  None. FINDINGS: Lower chest: Minimal bilateral dependent atelectasis. Borderline enlarged heart. Hepatobiliary: Small number of small liver cysts. Normal appearing gallbladder. Pancreas: Unremarkable. No pancreatic ductal dilatation or surrounding inflammatory changes. Spleen: Normal in size without focal abnormality. Adrenals/Urinary Tract: Lower pole right renal cyst. Normal appearing left kidney, ureters, urinary bladder and adrenal glands. Stomach/Bowel: Dilated appendix with diffuse wall thickening and enhancement and periappendiceal soft tissue stranding. There are also 2 small appendicoliths, measuring 6 mm and 4 mm in maximum diameter each. The maximum diameter at the appendix is 14.6 mm. No fluid collections or free peritoneal air. Normal appearing stomach, small bowel and colon. Vascular/Lymphatic: No significant vascular findings are present. No enlarged abdominal or pelvic lymph nodes. Reproductive: Moderately enlarged prostate gland containing coarse calcification. Other: Small umbilical hernia containing fat. Musculoskeletal: Lumbar and lower thoracic spine degenerative changes. IMPRESSION: 1. Acute appendicitis without abscess. 2. Small umbilical hernia containing fat. 3. Moderately enlarged prostate  gland. Electronically Signed   By: Claudie Revering M.D.   On: 11/22/2016 18:00    Procedures Dr. Donnie Mesa (11/22/16) - laparoscopic appendectomy  Hospital Course -  56 y/o male who presented to [facility] with 36 hours of lower abdominal pain that localized to his right lower quadrant. Workup significant for acute appendicitis and the patient was taken for the above procedure. He tolerated the procedure well and was transferred to the floor. He did develop some post-operative nausea which resolved and he was clinically stable for discharge on 11/23/16. He will follow up in our office as below.   Allergies as of 11/23/2016   No Known Allergies     Medication List    TAKE these medications   ondansetron 4 MG disintegrating tablet Commonly known as:  ZOFRAN-ODT Take 1 tablet (4 mg total) by mouth every 6 (six) hours as needed for nausea.   oxyCODONE 5 MG immediate release tablet Commonly known as:  Oxy IR/ROXICODONE Take 1-2 tablets (5-10 mg total) by mouth every 4 (four) hours as needed for moderate pain or severe pain.       Follow-up [facility], [place]. Go on 12/15/2016.   Specialty:  General Surgery Why:  Your appointment is 12/15/16 at 11:30AM, please arrive at least 30 min before your appointment to complete your check in paperwork.  If you are unable to arrive 30 min prior to your appointment time we may have to cancel or reschedule you. Contact information: [address] [place] [phone]          Signed: Obie Dredge, [facility] 11/24/2016, 12:44 PM Pager: [phone] Consults: [phone] Mon-Fri 7:00 am-4:30 pm Sat-Sun 7:00 am-11:30 am

## 2017-08-17 ENCOUNTER — Other Ambulatory Visit: Payer: Self-pay | Admitting: Surgery

## 2017-08-19 ENCOUNTER — Encounter (HOSPITAL_BASED_OUTPATIENT_CLINIC_OR_DEPARTMENT_OTHER): Payer: Self-pay | Admitting: *Deleted

## 2017-08-19 NOTE — Progress Notes (Signed)
Ensure pre surgery drink given with instructions to complete by 1100 am dos, pt verbalized understanding.

## 2017-08-24 NOTE — H&P (Signed)
  Douglas Summers Documented: 08/17/2017 3:10 PM Location: [facility] Patient #: [id number] DOB: 07-05-1961 Married / Language: Douglas Summers / Race: White Male   History of Present Illness (Douglas Raia A. Ninfa Linden MD; 08/17/2017 3:17 PM) The patient is a 57 year old male who presents with a complaint of Mass. This is a pleasant gentleman who we saw last year for an acute appendicitis who is status post laparoscopic appendectomy. He is here for evaluation of a right sided back mass. The mass is been present for several years but is now becoming larger and causing discomfort with both physical activity and lying down. He has had no previous similar masses. He denies trauma to the area. He has no constitutional symptoms. He is otherwise healthy without complaints.   Allergies (Douglas Summers, Douglas Summers; 08/17/2017 3:11 PM) No Known Allergies [12/15/2016]: Allergies Reconciled   Medication History (Douglas Summers, Wilder; 08/17/2017 3:11 PM) No Current Medications Medications Reconciled  Vitals (Douglas Summers; 08/17/2017 3:11 PM) 08/17/2017 3:10 PM Weight: 227.2 lb Height: 72in Body Surface Area: 2.25 m Body Mass Index: 30.81 kg/m  Temp.: 97.34F  Pulse: 60 (Regular)  BP: 126/84 (Sitting, Left Arm, Standard)       Physical Exam (Douglas Farish A. Ninfa Linden MD; 08/17/2017 3:18 PM) The physical exam findings are as follows: Note:On physical examination, on his right mid back, there is a 4 cm x 6 cm solid mass. There are no skin changes. It is slightly mobile. It is mildly tender. There are no other masses. Lungs are clear bilaterally Cardiovascular is regular rate and rhythm    Assessment & Plan (Douglas Goyne A. Ninfa Linden MD; 08/17/2017 3:19 PM) MASS ON BACK (R22.2) Impression: This is a right-sided back mass of uncertain etiology. As it is getting larger and causing discomfort, surgical excision is recommended. This is also for histologic evaluation to rule out malignancy.  I discussed the procedure with him in detail. We discussed the risks which includes but is not limited to bleeding, infection, injury to surrounding structures, recurrence, the need for further surgery if malignancy is identified, postoperative recovery, etc. He understands and wished to proceed with surgery which will be scheduled

## 2017-08-25 ENCOUNTER — Ambulatory Visit (HOSPITAL_BASED_OUTPATIENT_CLINIC_OR_DEPARTMENT_OTHER): Payer: BLUE CROSS/BLUE SHIELD | Admitting: Anesthesiology

## 2017-08-25 ENCOUNTER — Ambulatory Visit (HOSPITAL_BASED_OUTPATIENT_CLINIC_OR_DEPARTMENT_OTHER)
Admission: RE | Admit: 2017-08-25 | Discharge: 2017-08-25 | Disposition: A | Discharge status: Home / Self Care | Payer: BLUE CROSS/BLUE SHIELD | Source: Ambulatory Visit | Attending: Surgery | Admitting: Surgery

## 2017-08-25 ENCOUNTER — Encounter (HOSPITAL_BASED_OUTPATIENT_CLINIC_OR_DEPARTMENT_OTHER): Payer: Self-pay | Admitting: *Deleted

## 2017-08-25 ENCOUNTER — Other Ambulatory Visit: Payer: Self-pay

## 2017-08-25 ENCOUNTER — Encounter (HOSPITAL_BASED_OUTPATIENT_CLINIC_OR_DEPARTMENT_OTHER)
Admission: RE | Disposition: A | Discharge status: Home / Self Care | Payer: Self-pay | Source: Ambulatory Visit | Attending: Surgery

## 2017-08-25 DIAGNOSIS — D171 Benign lipomatous neoplasm of skin and subcutaneous tissue of trunk: Secondary | ICD-10-CM | POA: Diagnosis not present

## 2017-08-25 DIAGNOSIS — R222 Localized swelling, mass and lump, trunk: Secondary | ICD-10-CM | POA: Diagnosis present

## 2017-08-25 HISTORY — PX: MASS EXCISION: SHX2000

## 2017-08-25 HISTORY — DX: Nausea with vomiting, unspecified: R11.2

## 2017-08-25 HISTORY — DX: Other specified postprocedural states: Z98.890

## 2017-08-25 SURGERY — EXCISION MASS
Anesthesia: Monitor Anesthesia Care | Site: Back | Laterality: Right

## 2017-08-25 MED ORDER — CHLORHEXIDINE GLUCONATE CLOTH 2 % EX PADS: 6.0000 | MEDICATED_PAD | Freq: Once | CUTANEOUS | Status: DC

## 2017-08-25 MED ORDER — SODIUM BICARBONATE 4 % IV SOLN
INTRAVENOUS | Status: AC
  Filled 2017-08-25: qty 5

## 2017-08-25 MED ORDER — ACETAMINOPHEN 650 MG RE SUPP: 650.0000 mg | RECTAL | Status: DC | PRN

## 2017-08-25 MED ORDER — CELECOXIB 200 MG PO CAPS
200.0000 mg | ORAL_CAPSULE | ORAL | Status: AC
  Administered 2017-08-25: 200 mg via ORAL

## 2017-08-25 MED ORDER — GABAPENTIN 300 MG PO CAPS
ORAL_CAPSULE | ORAL | Status: AC
  Filled 2017-08-25: qty 1

## 2017-08-25 MED ORDER — SODIUM CHLORIDE 0.9 % IV SOLN: 250.0000 mL | INTRAVENOUS | Status: DC | PRN

## 2017-08-25 MED ORDER — OXYCODONE HCL 5 MG PO TABS: 5.0000 mg | ORAL_TABLET | ORAL | Status: DC | PRN

## 2017-08-25 MED ORDER — MORPHINE SULFATE (PF) 2 MG/ML IV SOLN: 1.0000 mg | INTRAVENOUS | Status: DC | PRN

## 2017-08-25 MED ORDER — LIDOCAINE HCL (PF) 1 % IJ SOLN
INTRAMUSCULAR | Status: AC
  Filled 2017-08-25: qty 30

## 2017-08-25 MED ORDER — LIDOCAINE 2% (20 MG/ML) 5 ML SYRINGE
INTRAMUSCULAR | Status: DC | PRN
  Administered 2017-08-25: 50 mg via INTRAVENOUS

## 2017-08-25 MED ORDER — ACETAMINOPHEN 500 MG PO TABS
1000.0000 mg | ORAL_TABLET | ORAL | Status: AC
  Administered 2017-08-25: 1000 mg via ORAL

## 2017-08-25 MED ORDER — SODIUM CHLORIDE 0.9% FLUSH: 3.0000 mL | Freq: Two times a day (BID) | INTRAVENOUS | Status: DC

## 2017-08-25 MED ORDER — LACTATED RINGERS IV SOLN
INTRAVENOUS | Status: DC
  Administered 2017-08-25 (×2): via INTRAVENOUS

## 2017-08-25 MED ORDER — ONDANSETRON HCL 4 MG/2ML IJ SOLN
INTRAMUSCULAR | Status: DC | PRN
  Administered 2017-08-25: 4 mg via INTRAVENOUS

## 2017-08-25 MED ORDER — MIDAZOLAM HCL 2 MG/2ML IJ SOLN
INTRAMUSCULAR | Status: AC
  Filled 2017-08-25: qty 2

## 2017-08-25 MED ORDER — ACETAMINOPHEN 325 MG PO TABS: 650.0000 mg | ORAL_TABLET | ORAL | Status: DC | PRN

## 2017-08-25 MED ORDER — ACETAMINOPHEN 500 MG PO TABS
ORAL_TABLET | ORAL | Status: AC
  Filled 2017-08-25: qty 2

## 2017-08-25 MED ORDER — LIDOCAINE HCL 1 % IJ SOLN
INTRAMUSCULAR | Status: DC | PRN
  Administered 2017-08-25: 14 mL via INTRAMUSCULAR
  Administered 2017-08-25: 6 mL via INTRAMUSCULAR

## 2017-08-25 MED ORDER — CEFAZOLIN SODIUM-DEXTROSE 2-4 GM/100ML-% IV SOLN
2.0000 g | INTRAVENOUS | Status: AC
  Administered 2017-08-25: 2 g via INTRAVENOUS

## 2017-08-25 MED ORDER — FENTANYL CITRATE (PF) 100 MCG/2ML IJ SOLN
INTRAMUSCULAR | Status: AC
  Filled 2017-08-25: qty 2

## 2017-08-25 MED ORDER — PROPOFOL 10 MG/ML IV BOLUS
INTRAVENOUS | Status: DC | PRN
  Administered 2017-08-25 (×2): 10 mg via INTRAVENOUS

## 2017-08-25 MED ORDER — GABAPENTIN 300 MG PO CAPS
300.0000 mg | ORAL_CAPSULE | ORAL | Status: AC
  Administered 2017-08-25: 300 mg via ORAL

## 2017-08-25 MED ORDER — OXYCODONE HCL 5 MG PO TABS: 5.0000 mg | ORAL_TABLET | Freq: Four times a day (QID) | ORAL | 0 refills | Status: AC | PRN

## 2017-08-25 MED ORDER — SODIUM CHLORIDE 0.9% FLUSH: 3.0000 mL | INTRAVENOUS | Status: DC | PRN

## 2017-08-25 MED ORDER — MIDAZOLAM HCL 2 MG/2ML IJ SOLN
1.0000 mg | INTRAMUSCULAR | Status: DC | PRN
  Administered 2017-08-25: 2 mg via INTRAVENOUS

## 2017-08-25 MED ORDER — HYDROMORPHONE HCL 1 MG/ML IJ SOLN: 0.2500 mg | INTRAMUSCULAR | Status: DC | PRN

## 2017-08-25 MED ORDER — PROMETHAZINE HCL 25 MG/ML IJ SOLN: 6.2500 mg | INTRAMUSCULAR | Status: DC | PRN

## 2017-08-25 MED ORDER — CELECOXIB 200 MG PO CAPS
ORAL_CAPSULE | ORAL | Status: AC
Start: 2017-08-25 — End: 2017-08-25
  Filled 2017-08-25: qty 1

## 2017-08-25 MED ORDER — FENTANYL CITRATE (PF) 100 MCG/2ML IJ SOLN
50.0000 ug | INTRAMUSCULAR | Status: DC | PRN
  Administered 2017-08-25: 100 ug via INTRAVENOUS

## 2017-08-25 MED ORDER — SCOPOLAMINE 1 MG/3DAYS TD PT72
1.0000 | MEDICATED_PATCH | Freq: Once | TRANSDERMAL | Status: DC | PRN
Start: 2017-08-25 — End: 2017-08-25

## 2017-08-25 SURGICAL SUPPLY — 42 items
ADH SKN CLS APL DERMABOND .7 (GAUZE/BANDAGES/DRESSINGS) ×1
BLADE CLIPPER SURG (BLADE) IMPLANT
BLADE HEX COATED 2.75 (ELECTRODE) ×2 IMPLANT
BLADE SURG 15 STRL LF DISP TIS (BLADE) ×1 IMPLANT
BLADE SURG 15 STRL SS (BLADE) ×2
CANISTER SUCT 1200ML W/VALVE (MISCELLANEOUS) IMPLANT
CHLORAPREP W/TINT 26ML (MISCELLANEOUS) ×2 IMPLANT
COVER BACK TABLE 60X90IN (DRAPES) ×2 IMPLANT
COVER MAYO STAND STRL (DRAPES) ×2 IMPLANT
DECANTER SPIKE VIAL GLASS SM (MISCELLANEOUS) IMPLANT
DERMABOND ADVANCED (GAUZE/BANDAGES/DRESSINGS) ×1
DERMABOND ADVANCED .7 DNX12 (GAUZE/BANDAGES/DRESSINGS) ×1 IMPLANT
DRAPE LAPAROTOMY 100X72 PEDS (DRAPES) ×2 IMPLANT
DRAPE UTILITY XL STRL (DRAPES) ×2 IMPLANT
ELECT REM PT RETURN 9FT ADLT (ELECTROSURGICAL) ×2
ELECTRODE REM PT RTRN 9FT ADLT (ELECTROSURGICAL) ×1 IMPLANT
GLOVE BIO SURGEON STRL SZ8.5 (GLOVE) ×2 IMPLANT
GLOVE BIOGEL PI IND STRL 7.0 (GLOVE) ×1 IMPLANT
GLOVE BIOGEL PI INDICATOR 7.0 (GLOVE) ×1
GLOVE EXAM NITRILE PF MED BLUE (GLOVE) ×2 IMPLANT
GLOVE SURG SIGNA 7.5 PF LTX (GLOVE) ×2 IMPLANT
GOWN STRL REUS W/ TWL LRG LVL3 (GOWN DISPOSABLE) ×2 IMPLANT
GOWN STRL REUS W/ TWL XL LVL3 (GOWN DISPOSABLE) IMPLANT
GOWN STRL REUS W/TWL LRG LVL3 (GOWN DISPOSABLE) ×4
GOWN STRL REUS W/TWL XL LVL3 (GOWN DISPOSABLE)
NEEDLE HYPO 25X1 1.5 SAFETY (NEEDLE) ×2 IMPLANT
NS IRRIG 1000ML POUR BTL (IV SOLUTION) IMPLANT
PACK BASIN DAY SURGERY FS (CUSTOM PROCEDURE TRAY) ×2 IMPLANT
PENCIL BUTTON HOLSTER BLD 10FT (ELECTRODE) ×2 IMPLANT
SLEEVE SCD COMPRESS KNEE MED (MISCELLANEOUS) IMPLANT
SPONGE LAP 4X18 X RAY DECT (DISPOSABLE) ×2 IMPLANT
SUT MNCRL AB 4-0 PS2 18 (SUTURE) ×2 IMPLANT
SUT VIC AB 2-0 SH 27 (SUTURE)
SUT VIC AB 2-0 SH 27XBRD (SUTURE) IMPLANT
SUT VIC AB 3-0 SH 27 (SUTURE) ×2
SUT VIC AB 3-0 SH 27X BRD (SUTURE) ×1 IMPLANT
SYR BULB 3OZ (MISCELLANEOUS) IMPLANT
SYR CONTROL 10ML LL (SYRINGE) ×2 IMPLANT
TOWEL OR 17X24 6PK STRL BLUE (TOWEL DISPOSABLE) ×2 IMPLANT
TOWEL OR NON WOVEN STRL DISP B (DISPOSABLE) ×2 IMPLANT
TUBE CONNECTING 20X1/4 (TUBING) IMPLANT
YANKAUER SUCT BULB TIP NO VENT (SUCTIONS) IMPLANT

## 2017-08-25 NOTE — Anesthesia Postprocedure Evaluation (Signed)
Anesthesia Post Note  Patient: Douglas Summers  Procedure(s) Performed: EXCISION BACK MASS (Right Back)     Patient location during evaluation: PACU Anesthesia Type: MAC Level of consciousness: awake and alert Pain management: pain level controlled Vital Signs Assessment: post-procedure vital signs reviewed and stable Respiratory status: spontaneous breathing and respiratory function stable Cardiovascular status: stable Postop Assessment: no apparent nausea or vomiting Anesthetic complications: no    Last Vitals:  Vitals:   08/25/17 1445 08/25/17 1509  BP: 119/61 (!) 144/87  Pulse: (!) 44 (!) 56  Resp: 14 16  Temp:  36.5 C  SpO2: 98% 97%    Last Pain:  Vitals:   08/25/17 1509  TempSrc: Oral                 Mattilyn Crites DANIEL

## 2017-08-25 NOTE — Op Note (Signed)
EXCISION BACK MASS  Procedure Note  NITISH ROES 08/25/2017   Pre-op Diagnosis: RIGHT BACK MASS     Post-op Diagnosis: same  Procedure(s): EXCISION BACK MASS (5 cm)  Surgeon(s): Coralie Keens, MD  Anesthesia: Monitor Anesthesia Care  Staff:  Circulator: Glenna Fellows, RN Scrub Person: Lynelle Doctor, RN  Estimated Blood Loss: Minimal               Specimens: sent to path  Procedure: The patient was brought to the operating room and identified as the correct patient.  He was placed supine on the operating room table and then turned into the left lateral decubitus position.  Anesthesia was then induced.  His right back was then prepped and draped in the usual sterile fashion.  I anesthetized the skin over the palpable and visible mass with Marcaine.  I made an incision with a scalpel and took this down into the subcutaneous tissue.  The patient had what appeared to be a 5 cm lipoma which I excised and removed with the cautery.  He was quite muscular and the underlying muscle spasm.  I did explore the underlying muscle to make sure there was no residual lipoma in or under the muscular tissue.  I did not identify any further mass.  I anesthetized the wound further.  I then closed the subtenons tissue with interrupted 3-0 Vicryl sutures after hemostasis was achieved.  I then closed the skin with a running 4-0 Monocryl.  Dermabond was then applied.  The patient tolerated the procedure well.  All the counts were correct at the end of the procedure.  The patient was then extubated in the operating room and taken in a stable condition to the recovery room.          Jefte Carithers A   Date: 08/25/2017  Time: 2:16 PM

## 2017-08-25 NOTE — Interval H&P Note (Signed)
History and Physical Interval Note: no change in H and P  08/25/2017 1:21 PM  Douglas Summers  has presented today for surgery, with the diagnosis of RIGHT BACK MASS  The various methods of treatment have been discussed with the patient and family. After consideration of risks, benefits and other options for treatment, the patient has consented to  Procedure(s): EXCISION BACK MASS (N/A) as a surgical intervention .  The patient's history has been reviewed, patient examined, no change in status, stable for surgery.  I have reviewed the patient's chart and labs.  Questions were answered to the patient's satisfaction.     Nikholas Geffre A

## 2017-08-25 NOTE — Transfer of Care (Signed)
Immediate Anesthesia Transfer of Care Note  Patient: Douglas Summers  Procedure(s) Performed: EXCISION BACK MASS (Right Back)  Patient Location: PACU  Anesthesia Type:MAC  Level of Consciousness: awake, alert  and oriented  Airway & Oxygen Therapy: Patient Spontanous Breathing and Patient connected to face mask oxygen  Post-op Assessment: Report given to RN and Post -op Vital signs reviewed and stable  Post vital signs: Reviewed and stable  Last Vitals:  Vitals:   08/25/17 1257  BP: (!) 141/77  Pulse: (!) 49  Resp: 14  Temp: 36.6 C  SpO2: 98%    Last Pain:  Vitals:   08/25/17 1257  TempSrc: Oral         Complications: No apparent anesthesia complications

## 2017-08-25 NOTE — Anesthesia Preprocedure Evaluation (Addendum)
Anesthesia Evaluation  Patient identified by MRN, date of birth, ID band Patient awake    Reviewed: Allergy & Precautions, NPO status , Patient's Chart, lab work & pertinent test results  History of Anesthesia Complications (+) PONV and history of anesthetic complications  Airway Mallampati: II  TM Distance: >3 FB Neck ROM: Full    Dental no notable dental hx. (+) Dental Advisory Given   Pulmonary neg pulmonary ROS,    Pulmonary exam normal        Cardiovascular negative cardio ROS Normal cardiovascular exam     Neuro/Psych negative neurological ROS  negative psych ROS   GI/Hepatic negative GI ROS, Neg liver ROS,   Endo/Other  negative endocrine ROS  Renal/GU negative Renal ROS     Musculoskeletal   Abdominal   Peds  Hematology negative hematology ROS (+)   Anesthesia Other Findings   Reproductive/Obstetrics                            Lab Results  Component Value Date   WBC 9.9 11/22/2016   HGB 15.0 11/22/2016   HCT 44.0 11/22/2016   MCV 88.9 11/22/2016   PLT 197 11/22/2016   Lab Results  Component Value Date   CREATININE 1.07 11/22/2016   BUN 15 11/22/2016   NA 136 11/22/2016   K 4.4 11/22/2016   CL 103 11/22/2016   CO2 26 11/22/2016    Anesthesia Physical  Anesthesia Plan  ASA: II  Anesthesia Plan: MAC   Post-op Pain Management:    Induction:   PONV Risk Score and Plan: 2 and Ondansetron and Dexamethasone  Airway Management Planned: Natural Airway and Simple Face Mask  Additional Equipment:   Intra-op Plan:   Post-operative Plan:   Informed Consent: I have reviewed the patients History and Physical, chart, labs and discussed the procedure including the risks, benefits and alternatives for the proposed anesthesia with the patient or authorized representative who has indicated his/her understanding and acceptance.   Dental advisory given  Plan Discussed  with: CRNA, Anesthesiologist and Surgeon  Anesthesia Plan Comments:        Anesthesia Quick Evaluation

## 2017-08-25 NOTE — Discharge Instructions (Signed)
°  Post Anesthesia Home Care Instructions  Activity: Get plenty of rest for the remainder of the day. A responsible individual must stay with you for 24 hours following the procedure.  For the next 24 hours, DO NOT: -Drive a car -Paediatric nurse -Drink alcoholic beverages -Take any medication unless instructed by your physician -Make any legal decisions or sign important papers.  Meals: Start with liquid foods such as gelatin or soup. Progress to regular foods as tolerated. Avoid greasy, spicy, heavy foods. If nausea and/or vomiting occur, drink only clear liquids until the nausea and/or vomiting subsides. Call your physician if vomiting continues.  Special Instructions/Symptoms: Your throat may feel dry or sore from the anesthesia or the breathing tube placed in your throat during surgery. If this causes discomfort, gargle with warm salt water. The discomfort should disappear within 24 hours.  If you had a scopolamine patch placed behind your ear for the management of post- operative nausea and/or vomiting:  1. The medication in the patch is effective for 72 hours, after which it should be removed.  Wrap patch in a tissue and discard in the trash. Wash hands thoroughly with soap and water. 2. You may remove the patch earlier than 72 hours if you experience unpleasant side effects which may include dry mouth, dizziness or visual disturbances. 3. Avoid touching the patch. Wash your hands with soap and water after contact with the patch.       You may start showering tomorrow.  No soaking in a tub for 1 week.  You may take Tylenol, ibuprofen, and use an ice pack also for pain.  No vigorous activity for 1 week

## 2017-08-26 ENCOUNTER — Encounter (HOSPITAL_BASED_OUTPATIENT_CLINIC_OR_DEPARTMENT_OTHER): Payer: Self-pay | Admitting: Surgery

## 2017-10-08 ENCOUNTER — Other Ambulatory Visit: Payer: Self-pay | Admitting: Surgery

## 2019-02-02 IMAGING — CT CT ABD-PELV W/ CM
2 of 5 series · 16 of 46 positions shown, 18 images · IV contrast (Omni 300)
Comparison: None.

CLINICAL DATA: Right lower quadrant abdominal pain since yesterday
morning.

EXAM:
CT ABDOMEN AND PELVIS WITH CONTRAST
TECHNIQUE: Multidetector CT imaging of the abdomen and pelvis was performed
using the standard protocol following bolus administration of
intravenous contrast.
CONTRAST:  100mL CJ64BV-[id number] IOPAMIDOL (CJ64BV-[id number]) INJECTION 61%

[Series 3: a/p w/ 5mm · axial · 0.81mm/px · z∈[+907,+1317]mm · 13 of 92 slices shown, 15 images]
[im 5/92  soft-tissue]
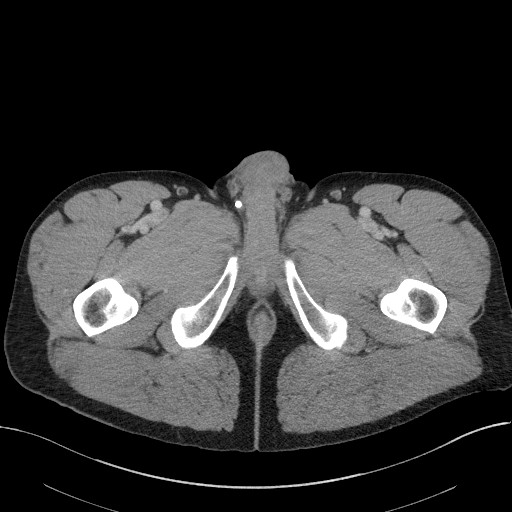
[im 5/92  bone]
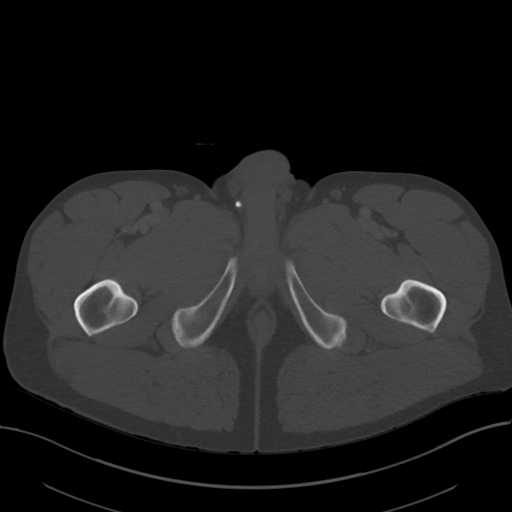
[im 14/92  soft-tissue]
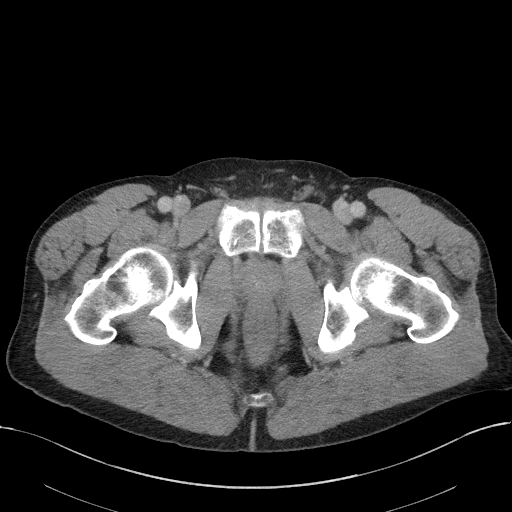
[im 19/92  soft-tissue]
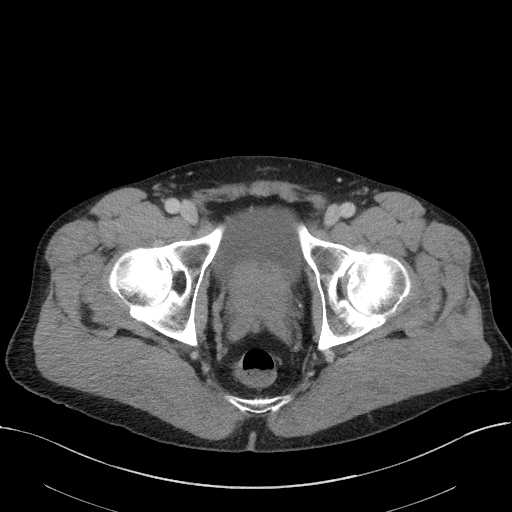
[im 28/92  soft-tissue]
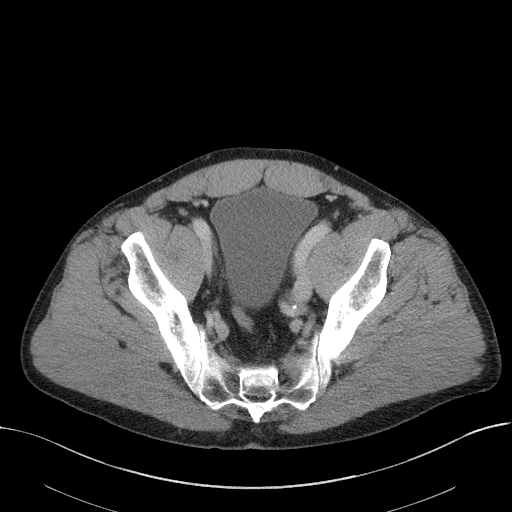
[im 32/92  soft-tissue]
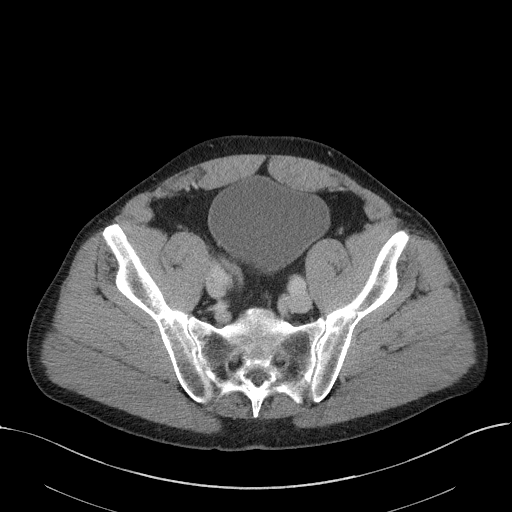
[im 41/92  soft-tissue]
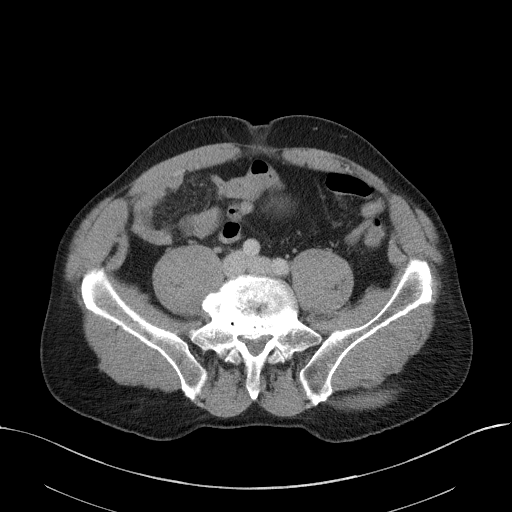
[im 46/92  soft-tissue]
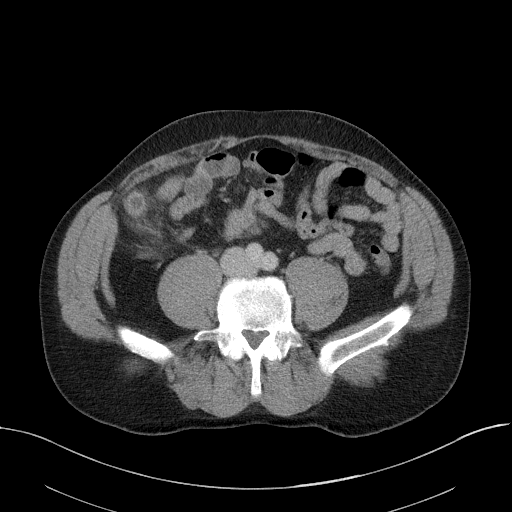
[im 51/92  soft-tissue]
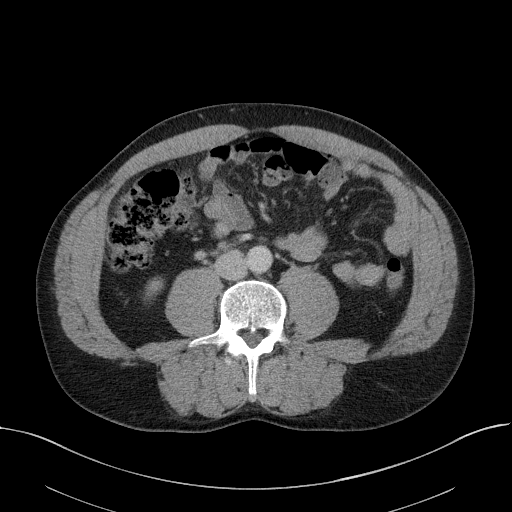
[im 60/92  soft-tissue]
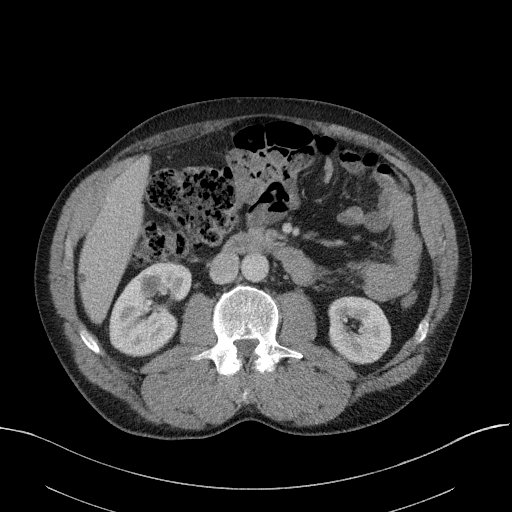
[im 60/92  bone]
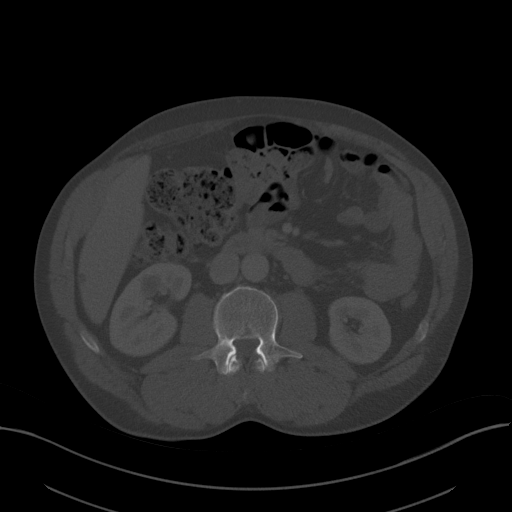
[im 64/92  soft-tissue]
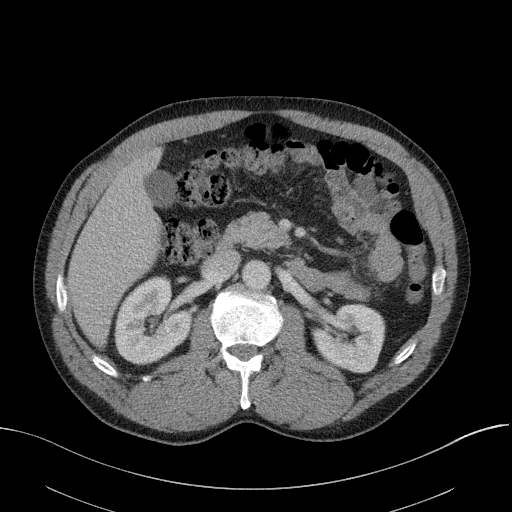
[im 73/92  soft-tissue]
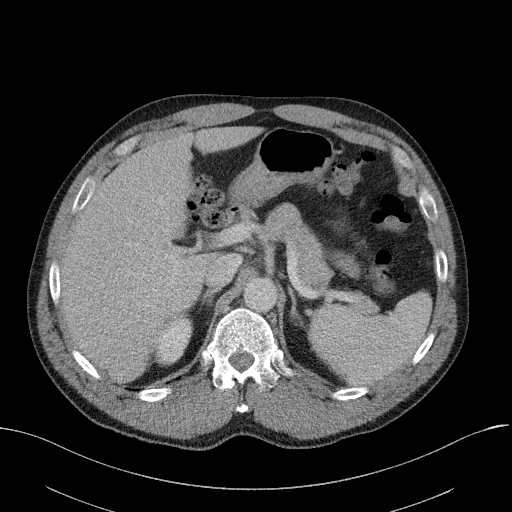
[im 78/92  soft-tissue]
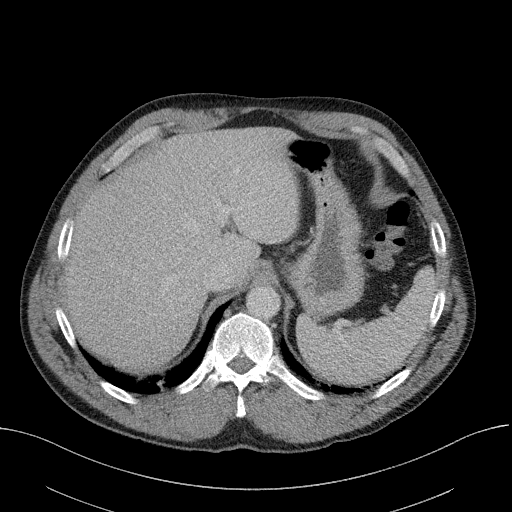
[im 87/92  soft-tissue]
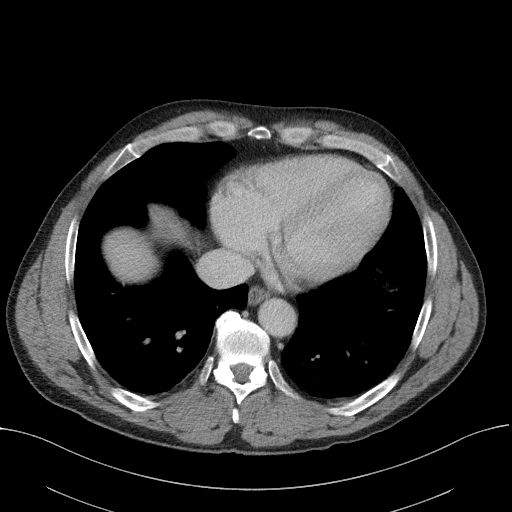

[Series 6: a/p w/ cor · coronal · 0.71mm/px · 3 of 151 slices shown]
[im 51/151  soft-tissue]
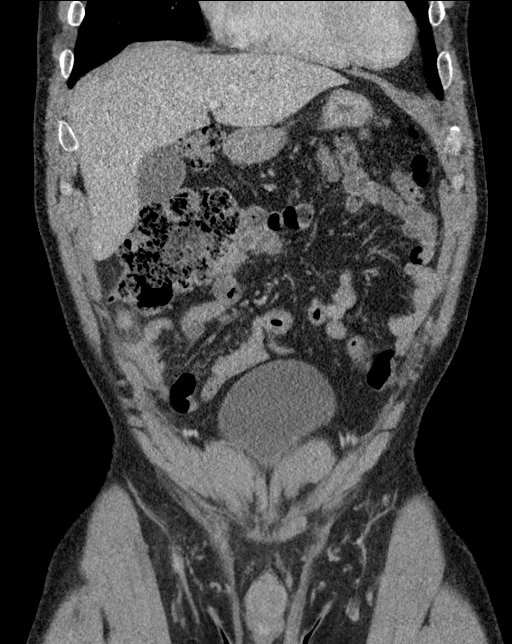
[im 67/151  soft-tissue]
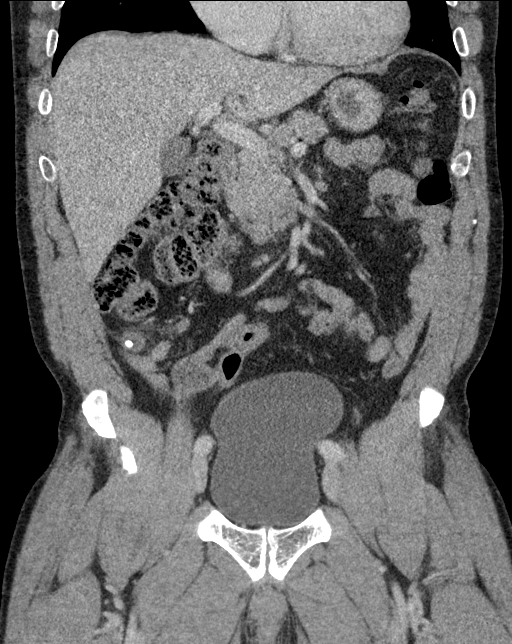
[im 84/151  soft-tissue]
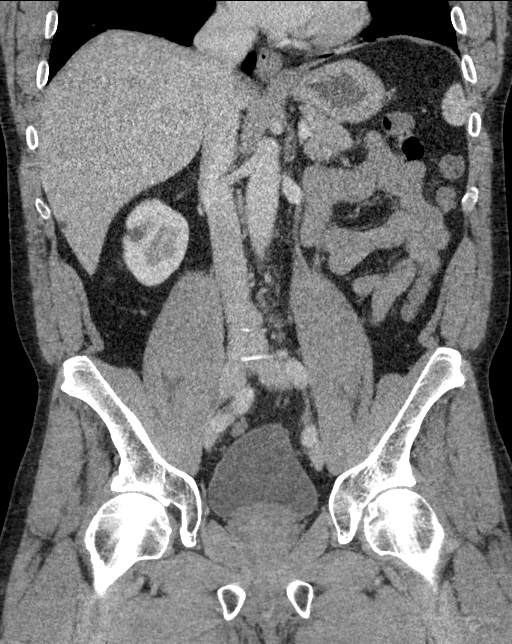

[16 of 46 positions shown; findings below may reference images not displayed]

FINDINGS: Lower chest: Minimal bilateral dependent atelectasis. Borderline
enlarged heart.

Hepatobiliary: Small number of small liver cysts. Normal appearing
gallbladder.

Pancreas: Unremarkable. No pancreatic ductal dilatation or
surrounding inflammatory changes.

Spleen: Normal in size without focal abnormality.

Adrenals/Urinary Tract: Lower pole right renal cyst. Normal
appearing left kidney, ureters, urinary bladder and adrenal glands.

Stomach/Bowel: Dilated appendix with diffuse wall thickening and
enhancement and periappendiceal soft tissue stranding. There are
also 2 small appendicoliths, measuring 6 mm and 4 mm in maximum
diameter each. The maximum diameter at the appendix is 14.6 mm. No
fluid collections or free peritoneal air.

Normal appearing stomach, small bowel and colon.

Vascular/Lymphatic: No significant vascular findings are present. No
enlarged abdominal or pelvic lymph nodes.

Reproductive: Moderately enlarged prostate gland containing coarse
calcification.

Other: Small umbilical hernia containing fat.

Musculoskeletal: Lumbar and lower thoracic spine degenerative
changes.
IMPRESSION: 1. Acute appendicitis without abscess.
2. Small umbilical hernia containing fat.
3. Moderately enlarged prostate gland.

## 2019-11-13 ENCOUNTER — Ambulatory Visit: Payer: BLUE CROSS/BLUE SHIELD

## 2019-12-12 ENCOUNTER — Other Ambulatory Visit (HOSPITAL_COMMUNITY): Payer: Self-pay | Admitting: Orthopedic Surgery

## 2019-12-12 ENCOUNTER — Other Ambulatory Visit: Payer: Self-pay

## 2019-12-12 ENCOUNTER — Ambulatory Visit (HOSPITAL_COMMUNITY)
Admission: RE | Admit: 2019-12-12 | Discharge: 2019-12-12 | Disposition: A | Discharge status: Home / Self Care | Payer: BC Managed Care – PPO | Source: Ambulatory Visit | Attending: Cardiology | Admitting: Cardiology

## 2019-12-12 DIAGNOSIS — M79605 Pain in left leg: Secondary | ICD-10-CM | POA: Diagnosis not present

## 2019-12-12 DIAGNOSIS — M79662 Pain in left lower leg: Secondary | ICD-10-CM | POA: Insufficient documentation

## 2020-10-29 ENCOUNTER — Other Ambulatory Visit: Payer: Self-pay

## 2020-10-29 ENCOUNTER — Encounter: Payer: Self-pay | Admitting: Physician Assistant

## 2020-10-29 ENCOUNTER — Ambulatory Visit (INDEPENDENT_AMBULATORY_CARE_PROVIDER_SITE_OTHER): Payer: BC Managed Care – PPO | Admitting: Physician Assistant

## 2020-10-29 DIAGNOSIS — L814 Other melanin hyperpigmentation: Secondary | ICD-10-CM

## 2020-10-29 DIAGNOSIS — Z1283 Encounter for screening for malignant neoplasm of skin: Secondary | ICD-10-CM

## 2020-10-29 DIAGNOSIS — D485 Neoplasm of uncertain behavior of skin: Secondary | ICD-10-CM

## 2020-10-29 DIAGNOSIS — L570 Actinic keratosis: Secondary | ICD-10-CM | POA: Diagnosis not present

## 2020-10-29 DIAGNOSIS — L821 Other seborrheic keratosis: Secondary | ICD-10-CM | POA: Diagnosis not present

## 2020-10-29 DIAGNOSIS — D1800 Hemangioma unspecified site: Secondary | ICD-10-CM

## 2020-10-29 DIAGNOSIS — L578 Other skin changes due to chronic exposure to nonionizing radiation: Secondary | ICD-10-CM

## 2020-10-29 DIAGNOSIS — D229 Melanocytic nevi, unspecified: Secondary | ICD-10-CM | POA: Diagnosis not present

## 2020-10-29 NOTE — Progress Notes (Signed)
   Follow-Up Visit   Subjective  Douglas Summers is a 60 y.o. male who presents for the following: Annual Exam (Right chest seb k? Little darker).   The following portions of the chart were reviewed this encounter and updated as appropriate:  Tobacco  Allergies  Meds  Problems  Med Hx  Surg Hx  Fam Hx      Objective  Well appearing patient in no apparent distress; mood and affect are within normal limits.  A full examination was performed including scalp, head, eyes, ears, nose, lips, neck, chest, axillae, abdomen, back, buttocks, bilateral upper extremities, bilateral lower extremities, hands, feet, fingers, toes, fingernails, and toenails. All findings within normal limits unless otherwise noted below.  Objective  Right Upper Chest: Hyperkeratotic scale with pink base        Objective  waist up and legs: No atypical nevi No signs of non-mole skin cancer.    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  Skin / nail biopsy Type of biopsy: tangential   Informed consent: discussed and consent obtained   Timeout: patient name, date of birth, surgical site, and procedure verified   Anesthesia: the lesion was anesthetized in a standard fashion   Anesthetic:  1% lidocaine w/ epinephrine 1-100,000 local infiltration Instrument used: flexible razor blade   Hemostasis achieved with: aluminum chloride and electrodesiccation   Outcome: patient tolerated procedure well   Post-procedure details: wound care instructions given    Specimen 1 - Surgical pathology Differential Diagnosis: R/O Atypia Cautery after biopsy Check Margins: No  Skin exam for malignant neoplasm waist up and legs  Yearly skin exam   Lentigines - Scattered tan macules - Due to sun exposure - Benign-appering, observe - Recommend daily broad spectrum sunscreen SPF 30+ to sun-exposed areas, reapply every 2 hours as needed. - Call for any changes  Lentigines -  Scattered tan macules - Discussed due to sun exposure - Benign, observe - Call for any changes  Seborrheic Keratoses - Stuck-on, waxy, tan-brown papules and plaques  - Discussed benign etiology and prognosis. - Observe - Call for any changes  Melanocytic Nevi - Tan-brown and/or pink-flesh-colored symmetric macules and papules - Benign appearing on exam today - Observation - Call clinic for new or changing moles - Recommend daily use of broad spectrum spf 30+ sunscreen to sun-exposed areas.   Hemangiomas - Red papules - Discussed benign nature - Observe - Call for any changes  Actinic Damage - diffuse scaly erythematous macules with underlying dyspigmentation - Recommend daily broad spectrum sunscreen SPF 30+ to sun-exposed areas, reapply every 2 hours as needed.  - Call for new or changing lesions.    I, Breia Ocampo, PA-C, have reviewed all documentation's for this visit.  The documentation on 10/29/20 for the exam, diagnosis, procedures and orders are all accurate and complete.

## 2020-10-29 NOTE — Patient Instructions (Signed)

## 2020-11-05 ENCOUNTER — Telehealth: Payer: Self-pay | Admitting: Physician Assistant

## 2020-11-05 NOTE — Telephone Encounter (Signed)
Phone call to let patient know that pathology results aren't back yet.

## 2020-11-05 NOTE — Telephone Encounter (Signed)
Bx results, KRS

## 2020-11-06 ENCOUNTER — Encounter: Payer: Self-pay | Admitting: Physician Assistant
